# Patient Record
Sex: Male | Born: 1950 | Race: Black or African American | Hispanic: No | Marital: Single | Smoking: Current some day smoker
Health system: Southern US, Community
[De-identification: ages and names within clinical notes are randomized; demographics above are authoritative.]

## PROBLEM LIST (undated history)

## (undated) DIAGNOSIS — E7800 Pure hypercholesterolemia, unspecified: Secondary | ICD-10-CM

## (undated) DIAGNOSIS — B182 Chronic viral hepatitis C: Secondary | ICD-10-CM

## (undated) DIAGNOSIS — K219 Gastro-esophageal reflux disease without esophagitis: Secondary | ICD-10-CM

## (undated) DIAGNOSIS — K625 Hemorrhage of anus and rectum: Secondary | ICD-10-CM

## (undated) DIAGNOSIS — I10 Essential (primary) hypertension: Secondary | ICD-10-CM

## (undated) HISTORY — PX: OTHER SURGICAL HISTORY: SHX169

## (undated) HISTORY — PX: COLONOSCOPY: SHX174

## (undated) HISTORY — PX: HEMORROIDECTOMY: SUR656

## (undated) HISTORY — PX: ABDOMINAL AORTIC ANEURYSM REPAIR: SUR1152

## (undated) HISTORY — PX: CHOLECYSTECTOMY: SHX55

## (undated) HISTORY — PX: HEMORRHOIDECTOMY WITH HEMORRHOID BANDING: SHX5633

---

## 2014-10-18 ENCOUNTER — Emergency Department (HOSPITAL_COMMUNITY)
Admission: EM | Admit: 2014-10-18 | Discharge: 2014-10-18 | Disposition: A | Discharge status: Home / Self Care | Attending: Emergency Medicine | Admitting: Emergency Medicine

## 2014-10-18 ENCOUNTER — Encounter (HOSPITAL_COMMUNITY): Payer: Self-pay | Admitting: *Deleted

## 2014-10-18 DIAGNOSIS — I739 Peripheral vascular disease, unspecified: Secondary | ICD-10-CM | POA: Insufficient documentation

## 2014-10-18 DIAGNOSIS — Z79899 Other long term (current) drug therapy: Secondary | ICD-10-CM | POA: Diagnosis not present

## 2014-10-18 DIAGNOSIS — R200 Anesthesia of skin: Secondary | ICD-10-CM | POA: Diagnosis present

## 2014-10-18 DIAGNOSIS — I10 Essential (primary) hypertension: Secondary | ICD-10-CM | POA: Insufficient documentation

## 2014-10-18 HISTORY — DX: Essential (primary) hypertension: I10

## 2014-10-18 NOTE — ED Notes (Signed)
Discharge instructions explained. Patient and CO both aware that the vasculare surgeon needs to be contact first this in the morning for an appointment. Contact information given.

## 2014-10-18 NOTE — ED Notes (Addendum)
Cramping in legs, numb sensation to legs. Most discomfort in lt leg, "I lose feeling in my feet"  Pt is a prisoner from [facility] with guard

## 2014-10-18 NOTE — ED Provider Notes (Signed)
CSN: [id number]     Arrival date & time 10/18/14  1737 History   First MD Initiated Contact with Patient 10/18/14 1843     No chief complaint on file.    (Consider location/radiation/quality/duration/timing/severity/associated sxs/prior Treatment) HPI Comments: Patient is a 63 year old male whois in the custody of the [facility]. The patient presents to the emergency department because of" cramping and numbness in bilateral legs". The patient states he has had some cramping sensation and some pins and needles sensation for approximately 3 or 4 months. In the last 3 days he states this pain is getting progressively worse. He has function of his foot, but states that it seems to be more intense. The patient states that he has more intense painlower leg and ankle  when he walks a certain distance, the pain gets better with rest. The patient denies any operations or procedures involving his lower extremities. He denies diabetes. He states he has had problems with alcohol in the past, but he has never been diagnosed with any neuropathies. He presents now for evaluation.  The history is provided by the patient.    Past Medical History  Diagnosis Date  . Hypertension    Past Surgical History  Procedure Laterality Date  . Hemorrhoidectomy with hemorrhoid banding    . Cholecystectomy     History reviewed. No pertinent family history. History  Substance Use Topics  . Smoking status: Never Smoker   . Smokeless tobacco: Not on file  . Alcohol Use: No    Review of Systems  Constitutional: Negative for activity change.       All ROS Neg except as noted in HPI  Eyes: Negative for photophobia and discharge.  Respiratory: Negative for cough, shortness of breath and wheezing.   Cardiovascular: Negative for chest pain, palpitations and leg swelling.  Gastrointestinal: Negative for abdominal pain and blood in stool.  Genitourinary: Negative for dysuria, frequency and hematuria.   Musculoskeletal: Positive for arthralgias. Negative for back pain and neck pain.  Skin: Negative.   Neurological: Negative for dizziness, seizures and speech difficulty.  Psychiatric/Behavioral: Negative for hallucinations and confusion.      Allergies  Review of patient's allergies indicates no known allergies.  Home Medications   Prior to Admission medications   Medication Sig Start Date End Date Taking? Authorizing Provider  hydrochlorothiazide (HYDRODIURIL) 25 MG tablet Take 25 mg by mouth daily.   Yes Historical Provider, MD  lisinopril (PRINIVIL,ZESTRIL) 20 MG tablet Take 20 mg by mouth daily.   Yes Historical Provider, MD  simvastatin (ZOCOR) 20 MG tablet Take 20 mg by mouth daily.   Yes Historical Provider, MD   BP 140/101 mmHg  Pulse 65  Temp(Src) 97.8 F (36.6 C) (Oral)  Resp 18  Ht 6\' 1"  (1.854 m)  Wt 180 lb (81.647 kg)  BMI 23.75 kg/m2  SpO2 100% Physical Exam  Constitutional: He is oriented to person, place, and time. He appears well-developed and well-nourished.  Non-toxic appearance.  HENT:  Head: Normocephalic.  Right Ear: Tympanic membrane and external ear normal.  Left Ear: Tympanic membrane and external ear normal.  Eyes: EOM and lids are normal. Pupils are equal, round, and reactive to light.  Neck: Normal range of motion. Neck supple. Carotid bruit is not present.  Cardiovascular: Normal rate, regular rhythm, normal heart sounds, intact distal pulses and normal pulses.   Pulmonary/Chest: Breath sounds normal. No respiratory distress.  Abdominal: Soft. Bowel sounds are normal. There is no tenderness. There is no guarding.  Musculoskeletal: Normal range of motion.  There is full range of motion of the right and left hip, right and left knee. There is decrease hair growth of the right and left lower extremity, left more than right. The left lower extremity is warm from the knee to approximately 3 inches below the anterior tibial tuberosity. There is  coolness but not cold from this point to the foot. The capillary refill is about 3 seconds. There is full range of motion of the toes and the ankle. Patient is ambulatory without problem.  I could not palpate a dorsalis pedis or posterior tibial on pulse on the left.  There is warmth from the anterior tibial tuberosity to the toes on the right, the dorsalis pedis pulse is 1+ and thready.  Lymphadenopathy:       Head (right side): No submandibular adenopathy present.       Head (left side): No submandibular adenopathy present.    He has no cervical adenopathy.  Neurological: He is alert and oriented to person, place, and time. He has normal strength. No cranial nerve deficit or sensory deficit.  Skin: Skin is warm and dry.  Psychiatric: He has a normal mood and affect. His speech is normal.  Nursing note and vitals reviewed.   ED Course Case discussed with Dr. Roderic Palau. Case discussed with Dr Bridgett Larsson (Vascular).  Procedures (including critical care time) Labs Review Labs Reviewed - No data to display  Imaging Review No results found.   EKG Interpretation None      MDM  Patient is a 63 year old male who presents to the emergency department with 3-4 months of tingling sensation in the left lower extremity. He describes symptoms consistent with intermittent claudication. He has good flexion and extension of the extremity. He has some mild temperature change involving the lower extremity, but the lower limb is not cold. The patient is amateur without problem.  Case discussed with Dr. Bridgett Larsson, vascular surgeon. They will arrange to see the patient on tomorrow in the office. Suspect the patient has acute on chronic peripheral vascular disease.   Final diagnoses:  None    *I have reviewed nursing notes, vital signs, and all appropriate lab and imaging results for this patient.**    Lenox Ahr, PA-C 10/18/14 [place], PA-C 10/18/14 [place],  MD 10/18/14 1950

## 2014-10-18 NOTE — ED Notes (Signed)
Only able to locate Posterior Tibia pulse in left foot, unable to doppler any pulses in right foot. Foot is warm. Patient states sensory normal in right foot, sensory is dull and feels like "pins and needles" in his left foot. [place] aware.

## 2014-10-18 NOTE — Discharge Instructions (Signed)
Your examination is consistent with acute on chronic peripheral arterial disease. Please call Dr. Donney Dice Peripheral Vascular Disease Peripheral Vascular Disease (PVD), also called Peripheral Arterial Disease (PAD), is a circulation problem caused by cholesterol (atherosclerotic plaque) deposits in the arteries. PVD commonly occurs in the lower extremities (legs) but it can occur in other areas of the body, such as your arms. The cholesterol buildup in the arteries reduces blood flow which can cause pain and other serious problems. The presence of PVD can place a person at risk for Coronary Artery Disease (CAD).  CAUSES  Causes of PVD can be many. It is usually associated with more than one risk factor such as:   High Cholesterol.  Smoking.  Diabetes.  Lack of exercise or inactivity.  High blood pressure (hypertension).  Obesity.  Family history. SYMPTOMS   When the lower extremities are affected, patients with PVD may experience:  Leg pain with exertion or physical activity. This is called INTERMITTENT CLAUDICATION. This may present as cramping or numbness with physical activity. The location of the pain is associated with the level of blockage. For example, blockage at the abdominal level (distal abdominal aorta) may result in buttock or hip pain. Lower leg arterial blockage may result in calf pain.  As PVD becomes more severe, pain can develop with less physical activity.  In people with severe PVD, leg pain may occur at rest.  Other PVD signs and symptoms:  Leg numbness or weakness.  Coldness in the affected leg or foot, especially when compared to the other leg.  A change in leg color.  Patients with significant PVD are more prone to ulcers or sores on toes, feet or legs. These may take longer to heal or may reoccur. The ulcers or sores can become infected.  If signs and symptoms of PVD are ignored, gangrene may occur. This can result in the loss of toes or loss of an entire  limb.  Not all leg pain is related to PVD. Other medical conditions can cause leg pain such as:  Blood clots (embolism) or Deep Vein Thrombosis.  Inflammation of the blood vessels (vasculitis).  Spinal stenosis. DIAGNOSIS  Diagnosis of PVD can involve several different types of tests. These can include:  Pulse Volume Recording Method (PVR). This test is simple, painless and does not involve the use of X-rays. PVR involves measuring and comparing the blood pressure in the arms and legs. An ABI (Ankle-Brachial Index) is calculated. The normal ratio of blood pressures is 1. As this number becomes smaller, it indicates more severe disease.  < 0.95 - indicates significant narrowing in one or more leg vessels.  <0.8 - there will usually be pain in the foot, leg or buttock with exercise.  <0.4 - will usually have pain in the legs at rest.  <0.25 - usually indicates limb threatening PVD.  Doppler detection of pulses in the legs. This test is painless and checks to see if you have a pulses in your legs/feet.  A dye or contrast material (a substance that highlights the blood vessels so they show up on x-ray) may be given to help your caregiver better see the arteries for the following tests. The dye is eliminated from your body by the kidney's. Your caregiver may order blood work to check your kidney function and other laboratory values before the following tests are performed:  Magnetic Resonance Angiography (MRA). An MRA is a picture study of the blood vessels and arteries. The MRA machine uses a large  magnet to produce images of the blood vessels.  Computed Tomography Angiography (CTA). A CTA is a specialized x-ray that looks at how the blood flows in your blood vessels. An IV may be inserted into your arm so contrast dye can be injected.  Angiogram. Is a procedure that uses x-rays to look at your blood vessels. This procedure is minimally invasive, meaning a small incision (cut) is made in  your groin. A small tube (catheter) is then inserted into the artery of your groin. The catheter is guided to the blood vessel or artery your caregiver wants to examine. Contrast dye is injected into the catheter. X-rays are then taken of the blood vessel or artery. After the images are obtained, the catheter is taken out. TREATMENT  Treatment of PVD involves many interventions which may include:  Lifestyle changes:  Quitting smoking.  Exercise.  Following a low fat, low cholesterol diet.  Control of diabetes.  Foot care is very important to the PVD patient. Good foot care can help prevent infection.  Medication:  Cholesterol-lowering medicine.  Blood pressure medicine.  Anti-platelet drugs.  Certain medicines may reduce symptoms of Intermittent Claudication.  Interventional/Surgical options:  Angioplasty. An Angioplasty is a procedure that inflates a balloon in the blocked artery. This opens the blocked artery to improve blood flow.  Stent Implant. A wire mesh tube (stent) is placed in the artery. The stent expands and stays in place, allowing the artery to remain open.  Peripheral Bypass Surgery. This is a surgical procedure that reroutes the blood around a blocked artery to help improve blood flow. This type of procedure may be performed if Angioplasty or stent implants are not an option. SEEK IMMEDIATE MEDICAL CARE IF:   You develop pain or numbness in your arms or legs.  Your arm or leg turns cold, becomes blue in color.  You develop redness, warmth, swelling and pain in your arms or legs. MAKE SURE YOU:   Understand these instructions.  Will watch your condition.  Will get help right away if you are not doing well or get worse. Document Released: 12/31/2004 Document Revised: 02/15/2012 Document Reviewed: 11/27/2008 [facility] Patient Information 2015 [place], [place]. This information is not intended to replace advice given to you by your health care provider. Make  sure you discuss any questions you have with your health care provider. n's office tomorrow morning for an appointment time. He is going to attempt to work you in for evaluation.

## 2015-01-22 DIAGNOSIS — I10 Essential (primary) hypertension: Secondary | ICD-10-CM | POA: Insufficient documentation

## 2015-01-24 DIAGNOSIS — R945 Abnormal results of liver function studies: Secondary | ICD-10-CM | POA: Insufficient documentation

## 2015-05-14 DIAGNOSIS — I714 Abdominal aortic aneurysm, without rupture, unspecified: Secondary | ICD-10-CM | POA: Insufficient documentation

## 2015-05-14 DIAGNOSIS — I70229 Atherosclerosis of native arteries of extremities with rest pain, unspecified extremity: Secondary | ICD-10-CM | POA: Insufficient documentation

## 2015-06-03 DIAGNOSIS — T148XXA Other injury of unspecified body region, initial encounter: Secondary | ICD-10-CM | POA: Insufficient documentation

## 2016-01-27 DIAGNOSIS — Z1211 Encounter for screening for malignant neoplasm of colon: Secondary | ICD-10-CM | POA: Insufficient documentation

## 2016-03-16 DIAGNOSIS — G5792 Unspecified mononeuropathy of left lower limb: Secondary | ICD-10-CM | POA: Insufficient documentation

## 2016-03-16 DIAGNOSIS — B182 Chronic viral hepatitis C: Secondary | ICD-10-CM | POA: Insufficient documentation

## 2016-03-16 DIAGNOSIS — E782 Mixed hyperlipidemia: Secondary | ICD-10-CM | POA: Insufficient documentation

## 2016-04-30 ENCOUNTER — Ambulatory Visit: Admission: RE | Admit: 2016-04-30 | Payer: Medicaid Other | Source: Ambulatory Visit | Admitting: Gastroenterology

## 2016-04-30 ENCOUNTER — Encounter: Admission: RE | Payer: Self-pay | Source: Ambulatory Visit

## 2016-04-30 SURGERY — COLONOSCOPY WITH PROPOFOL
Anesthesia: General

## 2016-08-20 ENCOUNTER — Ambulatory Visit: Payer: Medicaid Other | Admitting: Anesthesiology

## 2016-08-20 ENCOUNTER — Ambulatory Visit
Admission: RE | Admit: 2016-08-20 | Discharge: 2016-08-20 | Disposition: A | Discharge status: Home / Self Care | Payer: Medicaid Other | Source: Ambulatory Visit | Attending: Gastroenterology | Admitting: Gastroenterology

## 2016-08-20 ENCOUNTER — Encounter
Admission: RE | Disposition: A | Discharge status: Home / Self Care | Payer: Self-pay | Source: Ambulatory Visit | Attending: Gastroenterology

## 2016-08-20 ENCOUNTER — Encounter: Payer: Self-pay | Admitting: Gastroenterology

## 2016-08-20 DIAGNOSIS — D122 Benign neoplasm of ascending colon: Secondary | ICD-10-CM | POA: Insufficient documentation

## 2016-08-20 DIAGNOSIS — K640 First degree hemorrhoids: Secondary | ICD-10-CM | POA: Insufficient documentation

## 2016-08-20 DIAGNOSIS — K625 Hemorrhage of anus and rectum: Secondary | ICD-10-CM | POA: Insufficient documentation

## 2016-08-20 DIAGNOSIS — K5900 Constipation, unspecified: Secondary | ICD-10-CM | POA: Insufficient documentation

## 2016-08-20 DIAGNOSIS — E7800 Pure hypercholesterolemia, unspecified: Secondary | ICD-10-CM | POA: Diagnosis not present

## 2016-08-20 DIAGNOSIS — Z7902 Long term (current) use of antithrombotics/antiplatelets: Secondary | ICD-10-CM | POA: Insufficient documentation

## 2016-08-20 DIAGNOSIS — K219 Gastro-esophageal reflux disease without esophagitis: Secondary | ICD-10-CM | POA: Diagnosis not present

## 2016-08-20 DIAGNOSIS — I10 Essential (primary) hypertension: Secondary | ICD-10-CM | POA: Insufficient documentation

## 2016-08-20 DIAGNOSIS — B182 Chronic viral hepatitis C: Secondary | ICD-10-CM | POA: Insufficient documentation

## 2016-08-20 DIAGNOSIS — Z79899 Other long term (current) drug therapy: Secondary | ICD-10-CM | POA: Insufficient documentation

## 2016-08-20 DIAGNOSIS — Z8719 Personal history of other diseases of the digestive system: Secondary | ICD-10-CM | POA: Diagnosis present

## 2016-08-20 HISTORY — DX: Gastro-esophageal reflux disease without esophagitis: K21.9

## 2016-08-20 HISTORY — DX: Hemorrhage of anus and rectum: K62.5

## 2016-08-20 HISTORY — DX: Chronic viral hepatitis C: B18.2

## 2016-08-20 HISTORY — DX: Pure hypercholesterolemia, unspecified: E78.00

## 2016-08-20 HISTORY — PX: COLONOSCOPY WITH PROPOFOL: SHX5780

## 2016-08-20 LAB — CBC
HCT: 38.4 % — ABNORMAL LOW (ref 40.0–52.0)
Hemoglobin: 13.2 g/dL (ref 13.0–18.0)
MCH: 30.5 pg (ref 26.0–34.0)
MCHC: 34.3 g/dL (ref 32.0–36.0)
MCV: 89.1 fL (ref 80.0–100.0)
Platelets: 105 10*3/uL — ABNORMAL LOW (ref 150–440)
RBC: 4.31 MIL/uL — ABNORMAL LOW (ref 4.40–5.90)
RDW: 13 % (ref 11.5–14.5)
WBC: 3.6 10*3/uL — ABNORMAL LOW (ref 3.8–10.6)

## 2016-08-20 LAB — PROTIME-INR
INR: 1.13
Prothrombin Time: 14.6 seconds (ref 11.4–15.2)

## 2016-08-20 SURGERY — COLONOSCOPY WITH PROPOFOL
Anesthesia: General

## 2016-08-20 MED ORDER — SODIUM CHLORIDE 0.9 % IV SOLN: INTRAVENOUS | Status: DC

## 2016-08-20 MED ORDER — PROPOFOL 500 MG/50ML IV EMUL
INTRAVENOUS | Status: DC | PRN
  Administered 2016-08-20: 120 ug/kg/min via INTRAVENOUS

## 2016-08-20 MED ORDER — MIDAZOLAM HCL 2 MG/2ML IJ SOLN
INTRAMUSCULAR | Status: DC | PRN
  Administered 2016-08-20: 1 mg via INTRAVENOUS

## 2016-08-20 MED ORDER — PROPOFOL 10 MG/ML IV BOLUS
INTRAVENOUS | Status: DC | PRN
  Administered 2016-08-20: 120 mg via INTRAVENOUS

## 2016-08-20 MED ORDER — FENTANYL CITRATE (PF) 100 MCG/2ML IJ SOLN
INTRAMUSCULAR | Status: DC | PRN
  Administered 2016-08-20: 50 ug via INTRAVENOUS

## 2016-08-20 MED ORDER — SODIUM CHLORIDE 0.9 % IV SOLN
INTRAVENOUS | Status: DC
  Administered 2016-08-20: 1000 mL via INTRAVENOUS

## 2016-08-20 MED ORDER — EPHEDRINE SULFATE 50 MG/ML IJ SOLN
INTRAMUSCULAR | Status: DC | PRN
  Administered 2016-08-20 (×2): 10 mg via INTRAVENOUS

## 2016-08-20 NOTE — Op Note (Signed)
[facility] Gastroenterology Patient Name: Clayton Gill Procedure Date: 08/20/2016 8:41 AM MRN: [id number] Account #: [id number] Date of Birth: Oct 07, 1951 Admit Type: Outpatient Age: 65 Room: [facility] ENDO ROOM 4 Gender: Male Note Status: Finalized Procedure:            Colonoscopy Indications:          Rectal bleeding, Change in bowel habits, Constipation Providers:            Lollie Sails, MD Referring MD:         No Local Md, MD (Referring MD) Medicines:            Monitored Anesthesia Care Complications:        No immediate complications. Procedure:            Pre-Anesthesia Assessment:                       - ASA Grade Assessment: III - A patient with severe                        systemic disease.                       After obtaining informed consent, the colonoscope was                        passed under direct vision. Throughout the procedure,                        the patient's blood pressure, pulse, and oxygen                        saturations were monitored continuously. The                        Colonoscope was introduced through the anus and                        advanced to the the cecum, identified by appendiceal                        orifice and ileocecal valve. The colonoscopy was                        performed with moderate difficulty. Successful                        completion of the procedure was aided by changing the                        patient to a prone position. The quality of the bowel                        preparation was good. Findings:      A 12 mm polyp was found in the proximal ascending colon. The polyp was       sessile. The polyp was removed with a hot snare. Resection and retrieval       were complete. To prevent bleeding after the polypectomy, two hemostatic       clips were successfully placed. There was no bleeding at the end of the  maneuver.      Non-bleeding internal hemorrhoids were found during  anoscopy. The       hemorrhoids were small and Grade I (internal hemorrhoids that do not       prolapse).      atypical scarring at the anal orifice, no evidence of fistula.      The exam was otherwise without abnormality. Impression:           - One 12 mm polyp in the proximal ascending colon,                        removed with a hot snare. Resected and retrieved. Clips                        were placed.                       - Non-bleeding internal hemorrhoids.                       - The examination was otherwise normal. Recommendation:       - Await pathology results.                       - Use Citrucel one tablespoon PO daily daily.                       - Discharge patient to home.                       - Return to GI clinic in 1 month. Procedure Code(s):    --- Professional ---                       [id number], Colonoscopy, flexible; with removal of tumor(s),                        polyp(s), or other lesion(s) by snare technique Diagnosis Code(s):    --- Professional ---                       D12.2, Benign neoplasm of ascending colon                       K64.0, First degree hemorrhoids                       K62.5, Hemorrhage of anus and rectum                       R19.4, Change in bowel habit                       K59.00, Constipation, unspecified CPT copyright 2016 American Medical Association. All rights reserved. The codes documented in this report are preliminary and upon coder review may  be revised to meet current compliance requirements. Lollie Sails, MD 08/20/2016 9:26:46 AM This report has been signed electronically. Number of Addenda: 0 Note Initiated On: 08/20/2016 8:41 AM Scope Withdrawal Time: 0 hours 20 minutes 13 seconds  Total Procedure Duration: 0 hours 27 minutes 27 seconds       [facility]

## 2016-08-20 NOTE — Anesthesia Postprocedure Evaluation (Signed)
Anesthesia Post Note  Patient: Clayton Gill  Procedure(s) Performed: Procedure(s) (LRB): COLONOSCOPY WITH PROPOFOL (N/A)  Patient location during evaluation: PACU Anesthesia Type: General Level of consciousness: awake Pain management: pain level controlled Vital Signs Assessment: post-procedure vital signs reviewed and stable Respiratory status: spontaneous breathing Cardiovascular status: stable Anesthetic complications: no    Last Vitals:  Vitals:   08/20/16 0945 08/20/16 0955  BP: 127/73 127/77  Pulse: 63 62  Resp: 13 13  Temp:      Last Pain:  Vitals:   08/20/16 0925  TempSrc: Tympanic  PainSc:                  VAN STAVEREN,Clayton Gill

## 2016-08-20 NOTE — Transfer of Care (Signed)
Immediate Anesthesia Transfer of Care Note  Patient: Clayton Gill  Procedure(s) Performed: Procedure(s): COLONOSCOPY WITH PROPOFOL (N/A)  Patient Location: PACU  Anesthesia Type:General  Level of Consciousness: awake and alert   Airway & Oxygen Therapy: Patient Spontanous Breathing and Patient connected to nasal cannula oxygen  Post-op Assessment: Report given to RN and Post -op Vital signs reviewed and stable  Post vital signs: Reviewed  Last Vitals:  Vitals:   08/20/16 0744  BP: 111/78  Pulse: (!) 55  Resp: 16  Temp: (!) 35.6 C    Last Pain:  Vitals:   08/20/16 0744  TempSrc: Tympanic  PainSc: 4          Complications: No apparent anesthesia complications

## 2016-08-20 NOTE — H&P (Signed)
Outpatient short stay form Pre-procedure 08/20/2016 8:46 AM Clayton Sails MD  Primary Physician: Dr. Ward Givens  Reason for visit:  Colonoscopy  History of present illness:  Patient is a 65 year old male presenting today as above. He is been having increasing episodes of rectal bleeding. He does have a history of hemorrhoids however there is also been a change of bowel habits with more frequent constipation. He does have a history of chronic hepatitis C, his INR today was 1.13 with platelet count platelet count of 105.    Current Facility-Administered Medications:  .  0.9 %  sodium chloride infusion, , Intravenous, Continuous, Clayton Sails, MD, Last Rate: 20 mL/hr at 08/20/16 0806, 1,000 mL at 08/20/16 0806 .  0.9 %  sodium chloride infusion, , Intravenous, Continuous, Clayton Sails, MD  Facility-Administered Medications Ordered in Other Encounters:  .  fentaNYL (SUBLIMAZE) injection, , , Anesthesia Intra-op, Gijsbertus Lonia Mad, MD, 50 mcg at 08/20/16 0842 .  midazolam (VERSED) injection, , , Anesthesia Intra-op, Gijsbertus F Boston Service, MD, 1 mg at 08/20/16 0842 .  propofol (DIPRIVAN) 10 mg/mL bolus/IV push, , , Anesthesia Intra-op, Gijsbertus F Boston Service, MD, 120 mg at 08/20/16 0844  Prescriptions Prior to Admission  Medication Sig Dispense Refill Last Dose  . atorvastatin (LIPITOR) 40 MG tablet Take 40 mg by mouth daily.   Past Week at Unknown time  . clopidogrel (PLAVIX) 75 MG tablet Take 75 mg by mouth daily.   Past Week at Unknown time  . hydrochlorothiazide (HYDRODIURIL) 25 MG tablet Take 25 mg by mouth daily.   Past Week at Unknown time  . lisinopril (PRINIVIL,ZESTRIL) 40 MG tablet Take 40 mg by mouth daily.   Past Week at Unknown time  . pantoprazole (PROTONIX) 40 MG tablet Take 40 mg by mouth daily.   Past Week at Unknown time     No Known Allergies   Past Medical History:  Diagnosis Date  . Chronic hepatitis C ([place])   . GERD  (gastroesophageal reflux disease)   . High cholesterol   . Hypertension   . Rectal bleeding     Review of systems:      Physical Exam    Heart and lungs: Regular rate and rhythm without rub or gallop, lungs are bilaterally clear.    HEENT: Septic atraumatic eyes are anicteric    Other:     Pertinant exam for procedure: Soft nontender nondistended bowel sounds positive normoactive.    Planned proceedures: Colonoscopy and indicated procedures. I have discussed the risks benefits and complications of procedures to include not limited to bleeding, infection, perforation and the risk of sedation and the patient wishes to proceed.    Clayton Sails, MD Gastroenterology 08/20/2016  8:46 AM

## 2016-08-20 NOTE — Anesthesia Preprocedure Evaluation (Signed)
Anesthesia Evaluation  Patient identified by MRN, date of birth, ID band Patient awake    Reviewed: Allergy & Precautions, NPO status , Patient's Chart, lab work & pertinent test results  Airway Mallampati: II       Dental  (+) Teeth Intact   Pulmonary COPD, former smoker,     + decreased breath sounds      Cardiovascular Exercise Tolerance: Good hypertension, Pt. on medications  Rhythm:Regular     Neuro/Psych negative neurological ROS  negative psych ROS   GI/Hepatic GERD  Medicated,(+) Hepatitis -, C  Endo/Other  negative endocrine ROS  Renal/GU      Musculoskeletal   Abdominal Normal abdominal exam  (+)   Peds negative pediatric ROS (+)  Hematology negative hematology ROS (+)   Anesthesia Other Findings   Reproductive/Obstetrics                             Anesthesia Physical Anesthesia Plan  ASA: III  Anesthesia Plan: General   Post-op Pain Management:    Induction: Intravenous  Airway Management Planned: Natural Airway and Nasal Cannula  Additional Equipment:   Intra-op Plan:   Post-operative Plan:   Informed Consent: I have reviewed the patients History and Physical, chart, labs and discussed the procedure including the risks, benefits and alternatives for the proposed anesthesia with the patient or authorized representative who has indicated his/her understanding and acceptance.     Plan Discussed with:   Anesthesia Plan Comments:         Anesthesia Quick Evaluation

## 2016-08-21 LAB — SURGICAL PATHOLOGY

## 2016-08-25 ENCOUNTER — Encounter (HOSPITAL_COMMUNITY): Payer: Self-pay | Admitting: *Deleted

## 2016-09-24 ENCOUNTER — Other Ambulatory Visit: Payer: Self-pay | Admitting: Gastroenterology

## 2016-09-24 DIAGNOSIS — R768 Other specified abnormal immunological findings in serum: Secondary | ICD-10-CM

## 2016-09-29 ENCOUNTER — Ambulatory Visit
Admission: RE | Admit: 2016-09-29 | Discharge: 2016-09-29 | Disposition: A | Discharge status: Home / Self Care | Payer: Medicaid Other | Source: Ambulatory Visit | Attending: Gastroenterology | Admitting: Gastroenterology

## 2016-09-29 DIAGNOSIS — R768 Other specified abnormal immunological findings in serum: Secondary | ICD-10-CM

## 2016-10-06 ENCOUNTER — Ambulatory Visit: Payer: Medicaid Other

## 2016-10-13 ENCOUNTER — Ambulatory Visit
Admission: RE | Admit: 2016-10-13 | Discharge: 2016-10-13 | Disposition: A | Discharge status: Home / Self Care | Payer: Medicaid Other | Source: Ambulatory Visit | Attending: Gastroenterology | Admitting: Gastroenterology

## 2016-10-13 DIAGNOSIS — R160 Hepatomegaly, not elsewhere classified: Secondary | ICD-10-CM | POA: Insufficient documentation

## 2016-10-13 DIAGNOSIS — Z9049 Acquired absence of other specified parts of digestive tract: Secondary | ICD-10-CM | POA: Diagnosis not present

## 2016-10-13 DIAGNOSIS — R768 Other specified abnormal immunological findings in serum: Secondary | ICD-10-CM | POA: Diagnosis present

## 2016-10-20 ENCOUNTER — Other Ambulatory Visit: Payer: Self-pay | Admitting: Gastroenterology

## 2016-10-20 DIAGNOSIS — D1803 Hemangioma of intra-abdominal structures: Secondary | ICD-10-CM

## 2016-10-20 DIAGNOSIS — R9389 Abnormal findings on diagnostic imaging of other specified body structures: Secondary | ICD-10-CM

## 2016-10-27 ENCOUNTER — Ambulatory Visit: Payer: Medicaid Other

## 2016-11-05 ENCOUNTER — Ambulatory Visit
Admission: RE | Admit: 2016-11-05 | Discharge: 2016-11-05 | Disposition: A | Discharge status: Home / Self Care | Payer: Medicaid Other | Source: Ambulatory Visit | Attending: Gastroenterology | Admitting: Gastroenterology

## 2016-11-05 DIAGNOSIS — Z9889 Other specified postprocedural states: Secondary | ICD-10-CM | POA: Insufficient documentation

## 2016-11-05 DIAGNOSIS — D1803 Hemangioma of intra-abdominal structures: Secondary | ICD-10-CM | POA: Diagnosis not present

## 2016-11-05 DIAGNOSIS — R938 Abnormal findings on diagnostic imaging of other specified body structures: Secondary | ICD-10-CM | POA: Insufficient documentation

## 2016-11-05 DIAGNOSIS — Z9049 Acquired absence of other specified parts of digestive tract: Secondary | ICD-10-CM | POA: Diagnosis not present

## 2016-11-05 DIAGNOSIS — R9389 Abnormal findings on diagnostic imaging of other specified body structures: Secondary | ICD-10-CM

## 2016-11-05 LAB — POCT I-STAT CREATININE: CREATININE: 1.3 mg/dL — AB (ref 0.61–1.24)

## 2016-11-05 MED ORDER — GADOBENATE DIMEGLUMINE 529 MG/ML IV SOLN
15.0000 mL | Freq: Once | INTRAVENOUS | Status: AC | PRN
  Administered 2016-11-05: 15 mL via INTRAVENOUS

## 2019-09-15 ENCOUNTER — Other Ambulatory Visit: Payer: Self-pay

## 2019-09-15 DIAGNOSIS — Z20822 Contact with and (suspected) exposure to covid-19: Secondary | ICD-10-CM

## 2019-09-16 LAB — NOVEL CORONAVIRUS, NAA: SARS-CoV-2, NAA: NOT DETECTED

## 2019-10-02 ENCOUNTER — Encounter: Payer: Self-pay | Admitting: Family Medicine

## 2019-10-06 ENCOUNTER — Telehealth: Payer: Self-pay | Admitting: *Deleted

## 2019-10-06 NOTE — Telephone Encounter (Signed)
Received referral for low dose lung cancer screening CT scan. Message left at phone number listed in EMR for patient to call me back to facilitate scheduling scan.  

## 2019-10-09 ENCOUNTER — Telehealth: Payer: Self-pay | Admitting: *Deleted

## 2019-10-09 ENCOUNTER — Encounter: Payer: Self-pay | Admitting: *Deleted

## 2019-10-09 DIAGNOSIS — Z87891 Personal history of nicotine dependence: Secondary | ICD-10-CM

## 2019-10-09 DIAGNOSIS — Z122 Encounter for screening for malignant neoplasm of respiratory organs: Secondary | ICD-10-CM

## 2019-10-09 NOTE — Telephone Encounter (Signed)
Received referral for low dose lung cancer screening CT scan. Message left at phone number listed in EMR for patient to call me back to facilitate scheduling scan.  

## 2019-10-09 NOTE — Telephone Encounter (Signed)
Received referral for initial lung cancer screening scan. Contacted patient and obtained smoking history,(current, 30 pack year) as well as answering questions related to screening process. Patient denies signs of lung cancer such as weight loss or hemoptysis. Patient denies comorbidity that would prevent curative treatment if lung cancer were found. Patient is scheduled for shared decision making visit and CT scan on 10/19/19 at 2pm.

## 2019-10-10 ENCOUNTER — Telehealth: Payer: Self-pay | Admitting: Gastroenterology

## 2019-10-10 ENCOUNTER — Encounter: Payer: Self-pay | Admitting: *Deleted

## 2019-10-10 NOTE — Telephone Encounter (Signed)
Pt left vm regarding his referral

## 2019-10-10 NOTE — Telephone Encounter (Signed)
Attempted to return patients call to schedule his colonoscopy, however his voicemail box is currently full.  Thanks [employer]

## 2019-10-16 ENCOUNTER — Telehealth: Payer: Self-pay

## 2019-10-16 ENCOUNTER — Telehealth: Payer: Self-pay | Admitting: Gastroenterology

## 2019-10-16 NOTE — Telephone Encounter (Signed)
Returned patients call to schedule his colonoscopy, however his voicemail is currently full-unable to LVM.  I sent a mychart message for him to call office back to schedule.  Thanks [employer]

## 2019-10-16 NOTE — Telephone Encounter (Signed)
Pt left vm to schedule a colonoscopy   °

## 2019-10-17 ENCOUNTER — Telehealth: Payer: Self-pay

## 2019-10-17 NOTE — Telephone Encounter (Signed)
Returned patients call to schedule his colonoscopy, unfortunately I was unable to contact him due to his voicemail is currently full.  Thanks [employer]

## 2019-10-18 ENCOUNTER — Encounter: Payer: Self-pay | Admitting: Nurse Practitioner

## 2019-10-19 ENCOUNTER — Other Ambulatory Visit: Payer: Self-pay

## 2019-10-19 ENCOUNTER — Inpatient Hospital Stay: Payer: Medicare HMO | Attending: Nurse Practitioner | Admitting: Nurse Practitioner

## 2019-10-19 ENCOUNTER — Ambulatory Visit
Admission: RE | Admit: 2019-10-19 | Discharge: 2019-10-19 | Disposition: A | Discharge status: Home / Self Care | Payer: Medicare HMO | Source: Ambulatory Visit | Attending: Nurse Practitioner | Admitting: Nurse Practitioner

## 2019-10-19 DIAGNOSIS — Z87891 Personal history of nicotine dependence: Secondary | ICD-10-CM

## 2019-10-19 DIAGNOSIS — Z122 Encounter for screening for malignant neoplasm of respiratory organs: Secondary | ICD-10-CM

## 2019-10-19 NOTE — Progress Notes (Signed)
Virtual Visit via Video Enabled Telemedicine Note   I connected with Clayton Gill on 10/19/19 at 1:30 PM EST by video enabled telemedicine visit and verified that I am speaking with the correct person using two identifiers.   I discussed the limitations, risks, security and privacy concerns of performing an evaluation and management service by telemedicine and the availability of in-person appointments. I also discussed with the patient that there may be a patient responsible charge related to this service. The patient expressed understanding and agreed to proceed.   Other persons participating in the visit and their role in the encounter: Burgess Estelle, RN- checking in patient & navigation  Patient's location: [place]  Provider's location: Clinic  Chief Complaint: Low Dose CT Screening  Patient agreed to evaluation by telemedicine to discuss shared decision making for consideration of low dose CT lung cancer screening.    In accordance with CMS guidelines, patient has met eligibility criteria including age, absence of signs or symptoms of lung cancer.  Social History   Tobacco Use  . Smoking status: Current Every Day Smoker    Packs/day: 0.75    Years: 40.00    Pack years: 30.00    Types: Cigarettes  . Smokeless tobacco: Never Used  Substance Use Topics  . Alcohol use: No     A shared decision-making session was conducted prior to the performance of CT scan. This includes one or more decision aids, includes benefits and harms of screening, follow-up diagnostic testing, over-diagnosis, false positive rate, and total radiation exposure.   Counseling on the importance of adherence to annual lung cancer LDCT screening, impact of co-morbidities, and ability or willingness to undergo diagnosis and treatment is imperative for compliance of the program.   Counseling on the importance of continued smoking cessation for former smokers; the importance of smoking cessation for  current smokers, and information about tobacco cessation interventions have been given to patient including [place] and [address] [employer] [place] programs.   Written order for lung cancer screening with LDCT has been given to the patient and any and all questions have been answered to the best of my abilities.    Yearly follow up will be coordinated by Burgess Estelle, Thoracic Navigator.  I discussed the assessment and treatment plan with the patient. The patient was provided an opportunity to ask questions and all were answered. The patient agreed with the plan and demonstrated an understanding of the instructions.   The patient was advised to call back or seek an in-person evaluation if the symptoms worsen or if the condition fails to improve as anticipated.   I provided 15 minutes of face-to-face video visit time during this encounter, and > 50% was spent counseling as documented under my assessment & plan.   Beckey Rutter, DNP, AGNP-C [facility]

## 2019-10-19 NOTE — Telephone Encounter (Signed)
Pt left vm he state he has left several messages to schedule a colonoscopy and he would like to know what iu going on?

## 2019-10-20 ENCOUNTER — Other Ambulatory Visit: Payer: Self-pay

## 2019-10-20 ENCOUNTER — Telehealth: Payer: Self-pay

## 2019-10-20 DIAGNOSIS — Z860100 Personal history of colon polyps, unspecified: Secondary | ICD-10-CM

## 2019-10-20 DIAGNOSIS — Z1211 Encounter for screening for malignant neoplasm of colon: Secondary | ICD-10-CM

## 2019-10-20 NOTE — Telephone Encounter (Signed)
Gastroenterology Pre-Procedure Review  Request Date: 11/06/19 Requesting Physician: Dr. Vicente Males  PATIENT REVIEW QUESTIONS: The patient responded to the following health history questions as indicated:    1. Are you having any GI issues? yes (stomach discomfort after eating greasy or spicy foods.  declined an appt.) 2. Do you have a personal history of Polyps? yes (2017 with Dr. Gustavo Lah) 3. Do you have a family history of Colon Cancer or Polyps? no 4. Diabetes Mellitus? no 5. Joint replacements in the past 12 months?no 6. Major health problems in the past 3 months?no 7. Any artificial heart valves, MVP, or defibrillator?no    MEDICATIONS & ALLERGIES:    Patient reports the following regarding taking any anticoagulation/antiplatelet therapy:   Plavix, Coumadin, Eliquis, Xarelto, Lovenox, Pradaxa, Brilinta, or Effient? yes (Plavix Prescibed by Dr. Ladoris Gene) Aspirin? no  Patient confirms/reports the following medications:  Current Outpatient Medications  Medication Sig Dispense Refill  . atorvastatin (LIPITOR) 40 MG tablet Take 40 mg by mouth daily.    . clopidogrel (PLAVIX) 75 MG tablet Take 75 mg by mouth daily.    . hydrochlorothiazide (HYDRODIURIL) 25 MG tablet Take 25 mg by mouth daily.    . hydrochlorothiazide (HYDRODIURIL) 25 MG tablet Take 25 mg by mouth daily.    Marland Kitchen lisinopril (PRINIVIL,ZESTRIL) 20 MG tablet Take 20 mg by mouth daily.    Marland Kitchen lisinopril (PRINIVIL,ZESTRIL) 40 MG tablet Take 40 mg by mouth daily.    . pantoprazole (PROTONIX) 40 MG tablet Take 40 mg by mouth daily.    . simvastatin (ZOCOR) 20 MG tablet Take 20 mg by mouth daily.     No current facility-administered medications for this visit.     Patient confirms/reports the following allergies:  No Known Allergies  No orders of the defined types were placed in this encounter.   AUTHORIZATION INFORMATION Primary Insurance: 1D#: Group #:  Secondary Insurance: 1D#: Group #:  SCHEDULE INFORMATION: Date:   Time: Location:

## 2019-10-23 ENCOUNTER — Telehealth: Payer: Self-pay | Admitting: *Deleted

## 2019-10-23 NOTE — Telephone Encounter (Signed)
Notified patient of LDCT lung cancer screening program results with recommendation for 12 month follow up imaging. Also notified of incidental findings noted below and is encouraged to discuss further with PCP who will receive a copy of this note and/or the CT report. Patient verbalizes understanding.   IMPRESSION: 1. Lung-RADS 2S, benign appearance or behavior. Continue annual screening with low-dose chest CT without contrast in 12 months. 2. The "S" modifier above refers to potentially clinically significant non lung cancer related findings. Specifically, there is aortic atherosclerosis, in addition to 2 vessel coronary artery disease. Please note that although the presence of coronary artery calcium documents the presence of coronary artery disease, the severity of this disease and any potential stenosis cannot be assessed on this non-gated CT examination. Assessment for potential risk factor modification, dietary therapy or pharmacologic therapy may be warranted, if clinically indicated. 3. Mild diffuse bronchial wall thickening with mild centrilobular and paraseptal emphysema; imaging findings suggestive of underlying COPD. 4. 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 Attention at time of repeat low-dose lung cancer screening chest CT is recommended to ensure continued stability of this finding. 5. There are calcifications of the aortic valve. Echocardiographic correlation for evaluation of potential valvular dysfunction may be warranted if clinically indicated.  Aortic Atherosclerosis (ICD10-I70.0) and Emphysema (ICD10-J43.9).

## 2019-10-30 ENCOUNTER — Telehealth: Payer: Self-pay

## 2019-10-30 MED ORDER — NA SULFATE-K SULFATE-MG SULF 17.5-3.13-1.6 GM/177ML PO SOLN: 1.0000 | Freq: Once | ORAL | 0 refills | Status: AC

## 2019-10-30 NOTE — Telephone Encounter (Signed)
Patient has been advised to stop Plavix today, and restart 24 hours after his colonoscopy-per [facility] Request received from Dr. Ladoris Gene today.  Thanks [employer]

## 2019-11-01 ENCOUNTER — Other Ambulatory Visit
Admission: RE | Admit: 2019-11-01 | Discharge: 2019-11-01 | Disposition: A | Discharge status: Home / Self Care | Payer: Medicare HMO | Source: Ambulatory Visit | Attending: Gastroenterology | Admitting: Gastroenterology

## 2019-11-01 ENCOUNTER — Other Ambulatory Visit: Payer: Self-pay

## 2019-11-01 DIAGNOSIS — Z20828 Contact with and (suspected) exposure to other viral communicable diseases: Secondary | ICD-10-CM | POA: Insufficient documentation

## 2019-11-01 DIAGNOSIS — Z01812 Encounter for preprocedural laboratory examination: Secondary | ICD-10-CM | POA: Diagnosis present

## 2019-11-01 LAB — SARS CORONAVIRUS 2 (TAT 6-24 HRS): SARS Coronavirus 2: NEGATIVE

## 2019-11-03 ENCOUNTER — Other Ambulatory Visit: Payer: Medicare HMO

## 2019-11-06 ENCOUNTER — Ambulatory Visit
Admission: RE | Admit: 2019-11-06 | Discharge: 2019-11-06 | Disposition: A | Discharge status: Home / Self Care | Payer: Medicare HMO | Attending: Gastroenterology | Admitting: Gastroenterology

## 2019-11-06 ENCOUNTER — Ambulatory Visit: Payer: Medicare HMO | Admitting: Anesthesiology

## 2019-11-06 ENCOUNTER — Encounter
Admission: RE | Disposition: A | Discharge status: Home / Self Care | Payer: Self-pay | Source: Home / Self Care | Attending: Gastroenterology

## 2019-11-06 DIAGNOSIS — Z86010 Personal history of colonic polyps: Secondary | ICD-10-CM | POA: Diagnosis present

## 2019-11-06 DIAGNOSIS — K219 Gastro-esophageal reflux disease without esophagitis: Secondary | ICD-10-CM | POA: Diagnosis not present

## 2019-11-06 DIAGNOSIS — E7800 Pure hypercholesterolemia, unspecified: Secondary | ICD-10-CM | POA: Insufficient documentation

## 2019-11-06 DIAGNOSIS — F17210 Nicotine dependence, cigarettes, uncomplicated: Secondary | ICD-10-CM | POA: Diagnosis not present

## 2019-11-06 DIAGNOSIS — I10 Essential (primary) hypertension: Secondary | ICD-10-CM | POA: Diagnosis not present

## 2019-11-06 DIAGNOSIS — Z79899 Other long term (current) drug therapy: Secondary | ICD-10-CM | POA: Insufficient documentation

## 2019-11-06 DIAGNOSIS — B182 Chronic viral hepatitis C: Secondary | ICD-10-CM | POA: Insufficient documentation

## 2019-11-06 DIAGNOSIS — Z1211 Encounter for screening for malignant neoplasm of colon: Secondary | ICD-10-CM

## 2019-11-06 DIAGNOSIS — K635 Polyp of colon: Secondary | ICD-10-CM | POA: Diagnosis not present

## 2019-11-06 HISTORY — PX: COLONOSCOPY WITH PROPOFOL: SHX5780

## 2019-11-06 SURGERY — COLONOSCOPY WITH PROPOFOL
Anesthesia: General

## 2019-11-06 MED ORDER — PROPOFOL 10 MG/ML IV BOLUS
INTRAVENOUS | Status: DC | PRN
  Administered 2019-11-06: 40 mg via INTRAVENOUS
  Administered 2019-11-06: 50 mg via INTRAVENOUS

## 2019-11-06 MED ORDER — PROPOFOL 500 MG/50ML IV EMUL
INTRAVENOUS | Status: DC | PRN
  Administered 2019-11-06: 150 ug/kg/min via INTRAVENOUS

## 2019-11-06 MED ORDER — SODIUM CHLORIDE 0.9 % IV SOLN
INTRAVENOUS | Status: DC
  Administered 2019-11-06: 1000 mL via INTRAVENOUS
  Administered 2019-11-06: 11:00:00 via INTRAVENOUS

## 2019-11-06 NOTE — Op Note (Signed)
[facility] Gastroenterology Patient Name: Clayton Gill Procedure Date: 11/06/2019 10:18 AM MRN: [id number] Account #: [id number] Date of Birth: 04-21-1951 Admit Type: Outpatient Age: 68 Room: [facility] ENDO ROOM 4 Gender: Male Note Status: Finalized Procedure:             Colonoscopy Indications:           High risk colon cancer surveillance: Personal history                         of colonic polyps Providers:             Jonathon Bellows MD, MD Medicines:             Monitored Anesthesia Care Complications:         No immediate complications. Procedure:             Pre-Anesthesia Assessment:                        - Prior to the procedure, a History and Physical was                         performed, and patient medications, allergies and                         sensitivities were reviewed. The patient's tolerance                         of previous anesthesia was reviewed.                        - The risks and benefits of the procedure and the                         sedation options and risks were discussed with the                         patient. All questions were answered and informed                         consent was obtained.                        - ASA Grade Assessment: II - A patient with mild                         systemic disease.                        After obtaining informed consent, the colonoscope was                         passed under direct vision. Throughout the procedure,                         the patient's blood pressure, pulse, and oxygen                         saturations were monitored continuously. The  Colonoscope was introduced through the anus and                         advanced to the the cecum, identified by the                         appendiceal orifice. The colonoscopy was performed                         with ease. The patient tolerated the procedure well.                         The quality of  the bowel preparation was good. Findings:      The perianal and digital rectal examinations were normal.      A 7 mm polyp was found in the sigmoid colon. The polyp was sessile. The       polyp was removed with a cold snare. Resection and retrieval were       complete.      The exam was otherwise without abnormality on direct and retroflexion       views. Impression:            - One 7 mm polyp in the sigmoid colon, removed with a                         cold snare. Resected and retrieved.                        - The examination was otherwise normal on direct and                         retroflexion views. Recommendation:        - Discharge patient to home (with escort).                        - Resume previous diet.                        - Continue present medications.                        - Await pathology results.                        - Repeat colonoscopy in 5 years for surveillance. Procedure Code(s):     --- Professional ---                        [phone], Colonoscopy, flexible; with removal of                         tumor(s), polyp(s), or other lesion(s) by snare                         technique Diagnosis Code(s):     --- Professional ---                        K63.5, Polyp of colon  Z86.010, Personal history of colonic polyps CPT copyright 2019 American Medical Association. All rights reserved. The codes documented in this report are preliminary and upon coder review may  be revised to meet current compliance requirements. Jonathon Bellows, MD Jonathon Bellows MD, MD 11/06/2019 10:47:38 AM This report has been signed electronically. Number of Addenda: 0 Note Initiated On: 11/06/2019 10:18 AM Scope Withdrawal Time: 0 hours 10 minutes 17 seconds  Total Procedure Duration: 0 hours 12 minutes 56 seconds  Estimated Blood Loss:  Estimated blood loss: none.      [facility]

## 2019-11-06 NOTE — Transfer of Care (Signed)
Immediate Anesthesia Transfer of Care Note  Patient: Clayton Gill  Procedure(s) Performed: COLONOSCOPY WITH PROPOFOL (N/A )  Patient Location: PACU  Anesthesia Type:General  Level of Consciousness: awake, alert  and oriented  Airway & Oxygen Therapy: Patient Spontanous Breathing  Post-op Assessment: Report given to RN and Post -op Vital signs reviewed and stable  Post vital signs: Reviewed and stable  Last Vitals:  Vitals Value Taken Time  BP 98/73 11/06/19 1051  Temp 35.8 C 11/06/19 1051  Pulse 73 11/06/19 1056  Resp 21 11/06/19 1056  SpO2 100 % 11/06/19 1056  Vitals shown include unvalidated device data.  Last Pain:  Vitals:   11/06/19 0912  TempSrc: Tympanic         Complications: No apparent anesthesia complications

## 2019-11-06 NOTE — Anesthesia Post-op Follow-up Note (Signed)
Anesthesia QCDR form completed.        

## 2019-11-06 NOTE — Anesthesia Postprocedure Evaluation (Signed)
Anesthesia Post Note  Patient: Theatre manager  Procedure(s) Performed: COLONOSCOPY WITH PROPOFOL (N/A )  Patient location during evaluation: PACU Anesthesia Type: General Level of consciousness: awake and alert Pain management: pain level controlled Vital Signs Assessment: post-procedure vital signs reviewed and stable Respiratory status: spontaneous breathing, nonlabored ventilation and respiratory function stable Cardiovascular status: blood pressure returned to baseline and stable Postop Assessment: no apparent nausea or vomiting Anesthetic complications: no     Last Vitals:  Vitals:   11/06/19 0912 11/06/19 1051  BP: 117/84 98/73  Pulse: 67   Resp: 16   Temp: (!) 36.1 C (!) 35.8 C  SpO2: 100%     Last Pain:  Vitals:   11/06/19 1121  TempSrc:   PainSc: 0-No pain                 Durenda Hurt

## 2019-11-06 NOTE — Anesthesia Preprocedure Evaluation (Addendum)
Anesthesia Evaluation  Patient identified by MRN, date of birth, ID band Patient awake    Reviewed: Allergy & Precautions, H&P , NPO status , Patient's Chart, lab work & pertinent test results  Airway Mallampati: II  TM Distance: >3 FB     Dental  (+) Upper Dentures, Lower Dentures   Pulmonary Current Smoker,           Cardiovascular hypertension,      Neuro/Psych negative neurological ROS  negative psych ROS   GI/Hepatic GERD  Controlled,(+) Hepatitis -, C  Endo/Other  negative endocrine ROS  Renal/GU negative Renal ROS  negative genitourinary   Musculoskeletal   Abdominal   Peds  Hematology negative hematology ROS (+)   Anesthesia Other Findings Past Medical History: No date: Chronic hepatitis C (HCC) No date: GERD (gastroesophageal reflux disease) No date: High cholesterol No date: Hypertension No date: Rectal bleeding  Past Surgical History: No date: ABDOMINAL AORTIC ANEURYSM REPAIR No date: CHOLECYSTECTOMY No date: COLONOSCOPY 08/20/2016: COLONOSCOPY WITH PROPOFOL; N/A     Comment:  Procedure: COLONOSCOPY WITH PROPOFOL;  Surgeon: Lollie Sails, MD;  Location: [facility] ENDOSCOPY;  Service:               Endoscopy;  Laterality: N/A; No date: HEMORRHOIDECTOMY WITH HEMORRHOID BANDING No date: HEMORROIDECTOMY No date: surgery left leg     Reproductive/Obstetrics negative OB ROS                            Anesthesia Physical Anesthesia Plan  ASA: III  Anesthesia Plan: General   Post-op Pain Management:    Induction:   PONV Risk Score and Plan: Propofol infusion and TIVA  Airway Management Planned: Natural Airway and Nasal Cannula  Additional Equipment:   Intra-op Plan:   Post-operative Plan:   Informed Consent: I have reviewed the patients History and Physical, chart, labs and discussed the procedure including the risks, benefits and alternatives for  the proposed anesthesia with the patient or authorized representative who has indicated his/her understanding and acceptance.     Dental Advisory Given  Plan Discussed with: Anesthesiologist  Anesthesia Plan Comments:         Anesthesia Quick Evaluation

## 2019-11-06 NOTE — Anesthesia Procedure Notes (Signed)
Date/Time: 11/06/2019 10:25 AM Performed by: Allean Found, CRNA Pre-anesthesia Checklist: Patient identified, Emergency Drugs available, Suction available, Patient being monitored and Timeout performed Patient Re-evaluated:Patient Re-evaluated prior to induction Oxygen Delivery Method: Nasal cannula Placement Confirmation: positive ETCO2

## 2019-11-06 NOTE — H&P (Signed)
Jonathon Bellows, MD [address] [place], [place], [place], [address] [address], [place], [place], [place], [address] Phone: [phone]  Fax: [phone]  Primary Care Physician:  Theotis Burrow, MD   Pre-Procedure History & Physical: HPI:  Arhum Cayabyab is a 68 y.o. male is here for an colonoscopy.   Past Medical History:  Diagnosis Date  . Chronic hepatitis C ([place])   . GERD (gastroesophageal reflux disease)   . High cholesterol   . Hypertension   . Rectal bleeding     Past Surgical History:  Procedure Laterality Date  . ABDOMINAL AORTIC ANEURYSM REPAIR    . CHOLECYSTECTOMY    . COLONOSCOPY    . COLONOSCOPY WITH PROPOFOL N/A 08/20/2016   Procedure: COLONOSCOPY WITH PROPOFOL;  Surgeon: Lollie Sails, MD;  Location: [facility] ENDOSCOPY;  Service: Endoscopy;  Laterality: N/A;  . HEMORRHOIDECTOMY WITH HEMORRHOID BANDING    . HEMORROIDECTOMY    . surgery left leg      Prior to Admission medications   Medication Sig Start Date End Date Taking? Authorizing Provider  atorvastatin (LIPITOR) 40 MG tablet Take 40 mg by mouth daily.   Yes [provider]  clopidogrel (PLAVIX) 75 MG tablet Take 75 mg by mouth daily.   Yes [provider]  hydrochlorothiazide (HYDRODIURIL) 25 MG tablet Take 25 mg by mouth daily.   Yes [provider]  hydrochlorothiazide (HYDRODIURIL) 25 MG tablet Take 25 mg by mouth daily.   Yes [provider]  lisinopril (PRINIVIL,ZESTRIL) 20 MG tablet Take 20 mg by mouth daily.   Yes [provider]  lisinopril (PRINIVIL,ZESTRIL) 40 MG tablet Take 40 mg by mouth daily.   Yes [provider]  pantoprazole (PROTONIX) 40 MG tablet Take 40 mg by mouth daily.   Yes [provider]  simvastatin (ZOCOR) 20 MG tablet Take 20 mg by mouth daily.   Yes [provider]    Allergies as of 10/23/2019  . (No Known Allergies)    No family history on file.  Social History    Socioeconomic History  . Marital status: Single    Spouse name: Not on file  . Number of children: Not on file  . Years of education: Not on file  . Highest education level: Not on file  Occupational History  . Not on file  Social Needs  . Financial resource strain: Not on file  . Food insecurity    Worry: Not on file    Inability: Not on file  . Transportation needs    Medical: Not on file    Non-medical: Not on file  Tobacco Use  . Smoking status: Current Every Day Smoker    Packs/day: 0.75    Years: 40.00    Pack years: 30.00    Types: Cigarettes  . Smokeless tobacco: Never Used  Substance and Sexual Activity  . Alcohol use: No  . Drug use: No  . Sexual activity: Not on file  Lifestyle  . Physical activity    Days per week: Not on file    Minutes per session: Not on file  . Stress: Not on file  Relationships  . Social Herbalist on phone: Not on file    Gets together: Not on file    Attends religious service: Not on file    Active member of club or organization: Not on file    Attends meetings of clubs or organizations: Not on file    Relationship status:  Not on file  . Intimate partner violence    Fear of current or ex partner: Not on file    Emotionally abused: Not on file    Physically abused: Not on file    Forced sexual activity: Not on file  Other Topics Concern  . Not on file  Social History Narrative   ** Merged History Encounter **        Review of Systems: See HPI, otherwise negative ROS  Physical Exam: BP 117/84   Pulse 67   Temp (!) 96.9 F (36.1 C) (Tympanic)   Resp 16   SpO2 100%  General:   Alert,  pleasant and cooperative in NAD Head:  Normocephalic and atraumatic. Neck:  Supple; no masses or thyromegaly. Lungs:  Clear throughout to auscultation, normal respiratory effort.    Heart:  +S1, +S2, Regular rate and rhythm, No edema. Abdomen:  Soft, nontender and nondistended. Normal bowel sounds, without guarding, and without  rebound.   Neurologic:  Alert and  oriented x4;  grossly normal neurologically.  Impression/Plan: Romolo Silvan is here for an colonoscopy to be performed for surveillance due to prior history of colon polyps   Risks, benefits, limitations, and alternatives regarding  colonoscopy have been reviewed with the patient.  Questions have been answered.  All parties agreeable.   Jonathon Bellows, MD  11/06/2019, 10:24 AM

## 2019-11-07 ENCOUNTER — Encounter: Payer: Self-pay | Admitting: Gastroenterology

## 2019-11-07 LAB — SURGICAL PATHOLOGY

## 2019-11-10 ENCOUNTER — Other Ambulatory Visit: Payer: Self-pay

## 2019-11-10 ENCOUNTER — Ambulatory Visit (INDEPENDENT_AMBULATORY_CARE_PROVIDER_SITE_OTHER): Payer: Medicare HMO | Admitting: Vascular Surgery

## 2019-11-10 ENCOUNTER — Encounter (INDEPENDENT_AMBULATORY_CARE_PROVIDER_SITE_OTHER): Payer: Self-pay | Admitting: Vascular Surgery

## 2019-11-10 VITALS — BP 107/75 | HR 65 | Resp 16 | Wt 171.2 lb

## 2019-11-10 DIAGNOSIS — I70221 Atherosclerosis of native arteries of extremities with rest pain, right leg: Secondary | ICD-10-CM | POA: Diagnosis not present

## 2019-11-10 DIAGNOSIS — I10 Essential (primary) hypertension: Secondary | ICD-10-CM | POA: Diagnosis not present

## 2019-11-10 DIAGNOSIS — I714 Abdominal aortic aneurysm, without rupture, unspecified: Secondary | ICD-10-CM

## 2019-11-10 DIAGNOSIS — M792 Neuralgia and neuritis, unspecified: Secondary | ICD-10-CM | POA: Insufficient documentation

## 2019-11-10 NOTE — Assessment & Plan Note (Signed)
blood pressure control important in reducing the progression of atherosclerotic disease. On appropriate oral medications.  

## 2019-11-10 NOTE — Progress Notes (Signed)
Patient ID: Clayton Gill, male   DOB: 12-Dec-1950, 68 y.o.   MRN: [id number]  Chief Complaint  Patient presents with   New Patient (Initial Visit)    ref Ladoris Gene for PVD w/Claudication    HPI Clayton Gill is a 68 y.o. male.  I am asked to see the patient by Dr. Ladoris Gene for evaluation of PAD.  The patient has undergone a previous left leg bypass for severe PAD on the left leg.  He has disabling claudication symptoms on the right leg and can only walk about 50 feet before having to stop and rest.  He has never had any work done on the left leg.  His previous interventions and surgery were done at [facility].  He does not have open ulceration or infection.  The pain does sometimes wake him at night.  He still has some discomfort in the left leg with activity although it is fairly mild at this point.  There is no clear cause or inciting event that started the symptoms.  Nothing really made it better.  The pain is located in the muscular part of the thigh and calf on the right leg.     Past Medical History:  Diagnosis Date   Chronic hepatitis C (HCC)    GERD (gastroesophageal reflux disease)    High cholesterol    Hypertension    Rectal bleeding     Past Surgical History:  Procedure Laterality Date   ABDOMINAL AORTIC ANEURYSM REPAIR     CHOLECYSTECTOMY     COLONOSCOPY     COLONOSCOPY WITH PROPOFOL N/A 08/20/2016   Procedure: COLONOSCOPY WITH PROPOFOL;  Surgeon: Lollie Sails, MD;  Location: [facility] ENDOSCOPY;  Service: Endoscopy;  Laterality: N/A;   COLONOSCOPY WITH PROPOFOL N/A 11/06/2019   Procedure: COLONOSCOPY WITH PROPOFOL;  Surgeon: Jonathon Bellows, MD;  Location: [facility] ENDOSCOPY;  Service: Gastroenterology;  Laterality: N/A;   HEMORRHOIDECTOMY WITH HEMORRHOID BANDING     HEMORROIDECTOMY     surgery left leg    Left leg bypass surgery   Family History  Problem Relation Age of Onset   Hypertension Mother    Diabetes Mother   No bleeding or clotting  disorders   Social History   Tobacco Use   Smoking status: Current Every Day Smoker    Packs/day: 0.75    Years: 40.00    Pack years: 30.00    Types: Cigarettes   Smokeless tobacco: Never Used  Substance Use Topics   Alcohol use: No   Drug use: No    No Known Allergies  Current Outpatient Medications  Medication Sig Dispense Refill   amLODipine (NORVASC) 10 MG tablet Take 10 mg by mouth daily.     atorvastatin (LIPITOR) 40 MG tablet Take 40 mg by mouth daily.     clopidogrel (PLAVIX) 75 MG tablet Take 75 mg by mouth daily.     hydrochlorothiazide (HYDRODIURIL) 25 MG tablet Take 25 mg by mouth daily.     hydrochlorothiazide (HYDRODIURIL) 25 MG tablet Take 25 mg by mouth daily.     omeprazole (PRILOSEC) 20 MG capsule Take 20 mg by mouth daily.     pantoprazole (PROTONIX) 40 MG tablet Take 40 mg by mouth daily.     simvastatin (ZOCOR) 20 MG tablet Take 20 mg by mouth daily.     lisinopril (PRINIVIL,ZESTRIL) 20 MG tablet Take 20 mg by mouth daily.     lisinopril (PRINIVIL,ZESTRIL) 40 MG tablet Take 40 mg by mouth daily.  No current facility-administered medications for this visit.       REVIEW OF SYSTEMS (Negative unless checked)  Constitutional: [] Weight loss  [] Fever  [] Chills Cardiac: [] Chest pain   [] Chest pressure   [] Palpitations   [] Shortness of breath when laying flat   [] Shortness of breath at rest   [] Shortness of breath with exertion. Vascular:  [x] Pain in legs with walking   [] Pain in legs at rest   [] Pain in legs when laying flat   [x] Claudication   [x] Pain in feet when walking  [] Pain in feet at rest  [] Pain in feet when laying flat   [] History of DVT   [] Phlebitis   [] Swelling in legs   [] Varicose veins   [] Non-healing ulcers Pulmonary:   [] Uses home oxygen   [] Productive cough   [] Hemoptysis   [] Wheeze  [] COPD   [] Asthma Neurologic:  [] Dizziness  [] Blackouts   [] Seizures   [] History of stroke   [] History of TIA  [] Aphasia   [] Temporary blindness    [] Dysphagia   [] Weakness or numbness in arms   [] Weakness or numbness in legs Musculoskeletal:  [x] Arthritis   [] Joint swelling   [] Joint pain   [] Low back pain Hematologic:  [] Easy bruising  [] Easy bleeding   [] Hypercoagulable state   [] Anemic  [] Hepatitis Gastrointestinal:  [] Blood in stool   [] Vomiting blood  [x] Gastroesophageal reflux/heartburn   [] Abdominal pain Genitourinary:  [] Chronic kidney disease   [] Difficult urination  [] Frequent urination  [] Burning with urination   [] Hematuria Skin:  [] Rashes   [] Ulcers   [] Wounds Psychological:  [] History of anxiety   []  History of major depression.    Physical Exam BP 107/75 (BP Location: Left Arm)    Pulse 65    Resp 16    Wt 171 lb 3.2 oz (77.7 kg)    BMI 22.59 kg/m  Gen:  WD/WN, NAD Head: [place]/AT, No temporalis wasting.  Ear/Nose/Throat: Hearing grossly intact, nares w/o erythema or drainage, oropharynx w/o Erythema/Exudate Eyes: Conjunctiva clear, sclera non-icteric  Neck: trachea midline.  No JVD.  Pulmonary:  Good air movement, respirations not labored, no use of accessory muscles  Cardiac: RRR, no JVD Vascular:  Vessel Right Left  Radial Palpable Palpable                          DP  not palpable  trace  PT  1+  2+   Gastrointestinal:. No masses, surgical incisions, or scars. Musculoskeletal: M/S 5/5 throughout.  Extremities without ischemic changes.  No deformity or atrophy.  Well-healed surgical scar on the left medial leg.  Trace left lower extremity edema. Neurologic: Sensation grossly intact in extremities.  Symmetrical.  Speech is fluent. Motor exam as listed above. Psychiatric: Judgment intact, Mood & affect appropriate for pt's clinical situation. Dermatologic: No rashes or ulcers noted.  No cellulitis or open wounds.    Radiology Ct Chest Lung Cancer Screening Low Dose Wo Contrast  Result Date: 10/19/2019 CLINICAL DATA:  68 year old male current smoker with 30 pack-year history of smoking. Lung cancer  screening examination. EXAM: CT CHEST WITHOUT CONTRAST LOW-DOSE FOR LUNG CANCER SCREENING TECHNIQUE: Multidetector CT imaging of the chest was performed following the standard protocol without IV contrast. COMPARISON:  No priors. FINDINGS: Cardiovascular: Heart size is normal. There is no significant pericardial fluid, thickening or pericardial calcification. There is aortic atherosclerosis, as well as atherosclerosis of the great vessels of the mediastinum and the coronary arteries, including calcified atherosclerotic plaque in the left anterior descending  and right coronary arteries. Calcifications of the aortic valve. 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 Mediastinum/Nodes: No pathologically enlarged mediastinal or hilar lymph nodes. Please note that accurate exclusion of hilar adenopathy is limited on noncontrast CT scans. Esophagus is unremarkable in appearance. No axillary lymphadenopathy. Lungs/Pleura: Small pulmonary nodules are noted in the lungs bilaterally, largest of which is in the posterior aspect of the left upper lobe abutting the major fissure (axial image 109 of series 3), with a volume derived mean diameter of only 4.5 mm. No larger more suspicious appearing pulmonary nodules or masses are noted. No acute consolidative airspace disease. No pleural effusions. Diffuse bronchial wall thickening with mild centrilobular and paraseptal emphysema. Upper Abdomen: Status post cholecystectomy. Aortic atherosclerosis. Incompletely imaged aortic stent graft in the abdominal aorta. Musculoskeletal: There are no aggressive appearing lytic or blastic lesions noted in the visualized portions of the skeleton. IMPRESSION: 1. Lung-RADS 2S, benign appearance or behavior. Continue annual screening with low-dose chest CT without contrast in 12 months. 2. The "S" modifier above refers to potentially clinically significant non lung cancer related findings. Specifically, there is aortic  atherosclerosis, in addition to 2 vessel coronary artery disease. Please note that although the presence of coronary artery calcium documents the presence of coronary artery disease, the severity of this disease and any potential stenosis cannot be assessed on this non-gated CT examination. Assessment for potential risk factor modification, dietary therapy or pharmacologic therapy may be warranted, if clinically indicated. 3. Mild diffuse bronchial wall thickening with mild centrilobular and paraseptal emphysema; imaging findings suggestive of underlying COPD. 4. 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 Attention at time of repeat low-dose lung cancer screening chest CT is recommended to ensure continued stability of this finding. 5. There are calcifications of the aortic valve. Echocardiographic correlation for evaluation of potential valvular dysfunction may be warranted if clinically indicated. Aortic Atherosclerosis (ICD10-I70.0) and Emphysema (ICD10-J43.9). Electronically Signed   By: Vinnie Langton M.D.   On: 10/19/2019 15:27    Labs Recent Results (from the past 2160 hour(s))  Novel Coronavirus, NAA ([facility])     Status: None   Collection Time: 09/15/19 12:00 AM   Specimen: Nasopharyngeal(NP) swabs in vial transport medium   NASOPHARYNGE  TESTING  Result Value Ref Range   SARS-CoV-2, NAA Not Detected Not Detected    Comment: Testing was performed using the cobas(R) SARS-CoV-2 test. This nucleic acid amplification test was developed and its performance characteristics determined by [employer]. Nucleic acid amplification tests include PCR and TMA. This test has not been FDA cleared or approved. This test has been authorized by FDA under an Emergency Use Authorization (EUA). This test is only authorized for the duration of time the declaration that circumstances exist justifying the authorization of the emergency use of in vitro diagnostic tests for detection  of SARS-CoV-2 virus and/or diagnosis of COVID-19 infection under section 564(b)(1) of the Act, 21 U.S.C. [id number]) (1), unless the authorization is terminated or revoked sooner. When diagnostic testing is negative, the possibility of a false negative result should be considered in the context of a patient's recent exposures and the presence of clinical signs and symptoms consistent with COVID-19. An individual without symptoms  of COVID-19 and who is not shedding SARS-CoV-2 virus would expect to have a negative (not detected) result in this assay.   SARS CORONAVIRUS 2 (TAT 6-24 HRS) Nasopharyngeal Nasopharyngeal Swab     Status: None   Collection Time: 11/01/19 11:01 AM  Specimen: Nasopharyngeal Swab  Result Value Ref Range   SARS Coronavirus 2 NEGATIVE NEGATIVE    Comment: (NOTE) SARS-CoV-2 target nucleic acids are NOT DETECTED. The SARS-CoV-2 RNA is generally detectable in upper and lower respiratory specimens during the acute phase of infection. Negative results do not preclude SARS-CoV-2 infection, do not rule out co-infections with other pathogens, and should not be used as the sole basis for treatment or other patient management decisions. Negative results must be combined with clinical observations, patient history, and epidemiological information. The expected result is Negative. Fact Sheet for Patients: SugarRoll.be Fact Sheet for Healthcare Providers: https://www.woods-mathews.com/ This test is not yet approved or cleared by the [place] FDA and  has been authorized for detection and/or diagnosis of SARS-CoV-2 by FDA under an Emergency Use Authorization (EUA). This EUA will remain  in effect (meaning this test can be used) for the duration of the COVID-19 declaration under Section 56 4(b)(1) of the Act, 21 U.S.C. section 360bbb-3(b)(1), unless the authorization is terminated or revoked sooner. Performed at [facility], [place] [address], [place], [place] [address]   Surgical pathology     Status: None   Collection Time: 11/06/19 10:42 AM  Result Value Ref Range   SURGICAL PATHOLOGY      SURGICAL PATHOLOGY CASE: [id number] PATIENT: Verdis Frederickson Surgical Pathology Report     Specimen Submitted: A. Colon polyp, sigmoid; cold snare  Clinical History: Screening colonoscopy, personal history of colon polyps. Colon polyp.      DIAGNOSIS: A. COLON POLYP, SIGMOID; COLD SNARE: - HYPERPLASTIC POLYP. - NEGATIVE FOR DYSPLASIA AND MALIGNANCY.   GROSS DESCRIPTION: A. Labeled: Cold snare polyp sigmoid colon Received: In formalin Tissue fragment(s): 1 Size: 1.1 x 0.2 x 0.2 cm Description: Tan soft tissue fragment Entirely submitted in 1 cassette.    Final Diagnosis performed by Betsy Pries, MD.   Electronically signed 11/07/2019 8:35:40AM The electronic signature indicates that the named Attending Pathologist has evaluated the specimen Technical component performed at [facility], [address], [place], [place] [address] Lab: [phone] Dir: Rush Farmer, MD, MMM  Professional component performed at [facility], [place], [place], [place] [address] Lab: [phone] Dir: Dellia Nims. Reuel Derby, MD     Assessment/Plan:  No problem-specific Assessment & Plan notes found for this encounter.      Leotis Pain 11/10/2019, 10:52 AM   This note was created with Dragon medical transcription system.  Any errors from dictation are unintentional.

## 2019-11-10 NOTE — Assessment & Plan Note (Signed)
Not sure when this was last checked.  May complicate his angiograms and make it difficult to go up and over for treatment.  We will also need to be checked at some point going forward with noninvasive studies.

## 2019-11-10 NOTE — Patient Instructions (Signed)
Peripheral Vascular Disease  Peripheral vascular disease (PVD) is a disease of the blood vessels that are not part of your heart and brain. A simple term for PVD is poor circulation. In most cases, PVD narrows the blood vessels that carry blood from your heart to the rest of your body. This can reduce the supply of blood to your arms, legs, and internal organs, like your stomach or kidneys. However, PVD most often affects a person's lower legs and feet. Without treatment, PVD tends to get worse. PVD can also lead to acute ischemic limb. This is when an arm or leg suddenly cannot get enough blood. This is a medical emergency. Follow these instructions at home: Lifestyle  Do not use any products that contain nicotine or tobacco, such as cigarettes and e-cigarettes. If you need help quitting, ask your doctor.  Lose weight if you are overweight. Or, stay at a healthy weight as told by your doctor.  Eat a diet that is low in fat and cholesterol. If you need help, ask your doctor.  Exercise regularly. Ask your doctor for activities that are right for you. General instructions  Take over-the-counter and prescription medicines only as told by your doctor.  Take good care of your feet: ? Wear comfortable shoes that fit well. ? Check your feet often for any cuts or sores.  Keep all follow-up visits as told by your doctor This is important. Contact a doctor if:  You have cramps in your legs when you walk.  You have leg pain when you are at rest.  You have coldness in a leg or foot.  Your skin changes.  You are unable to get or have an erection (erectile dysfunction).  You have cuts or sores on your feet that do not heal. Get help right away if:  Your arm or leg turns cold, numb, and blue.  Your arms or legs become red, warm, swollen, painful, or numb.  You have chest pain.  You have trouble breathing.  You suddenly have weakness in your face, arm, or leg.  You become very  confused or you cannot speak.  You suddenly have a very bad headache.  You suddenly cannot see. Summary  Peripheral vascular disease (PVD) is a disease of the blood vessels.  A simple term for PVD is poor circulation. Without treatment, PVD tends to get worse.  Treatment may include exercise, low fat and low cholesterol diet, and quitting smoking. This information is not intended to replace advice given to you by your health care provider. Make sure you discuss any questions you have with your health care provider. Document Released: 02/17/2010 Document Revised: 11/05/2017 Document Reviewed: 12/31/2016 Elsevier Patient Education  2020 [employer] Inc.  

## 2019-11-10 NOTE — Assessment & Plan Note (Signed)
The patient has disabling claudication symptoms and signs of early rest pain on the right leg.  His previous interventions and surgery have all been on the left leg.  We will obtain duplex and ABIs in the near future at his convenience to get further improve patient about his disease process.  With a stent graft repair, he may need a diagnostic angiogram followed by either an arm or a pedal access depending on the locations of the lesions.  This is a difficult and challenging situation particularly given the previous aneurysm repair.  We will see him back with noninvasive studies in the near future.

## 2019-11-24 ENCOUNTER — Encounter (INDEPENDENT_AMBULATORY_CARE_PROVIDER_SITE_OTHER): Payer: Self-pay | Admitting: Nurse Practitioner

## 2019-11-24 ENCOUNTER — Ambulatory Visit (INDEPENDENT_AMBULATORY_CARE_PROVIDER_SITE_OTHER): Payer: Medicare HMO

## 2019-11-24 ENCOUNTER — Other Ambulatory Visit: Payer: Self-pay

## 2019-11-24 ENCOUNTER — Ambulatory Visit (INDEPENDENT_AMBULATORY_CARE_PROVIDER_SITE_OTHER): Payer: Medicare HMO | Admitting: Nurse Practitioner

## 2019-11-24 VITALS — BP 133/84 | HR 73 | Resp 17 | Ht 73.0 in | Wt 172.0 lb

## 2019-11-24 DIAGNOSIS — I10 Essential (primary) hypertension: Secondary | ICD-10-CM | POA: Diagnosis not present

## 2019-11-24 DIAGNOSIS — I70221 Atherosclerosis of native arteries of extremities with rest pain, right leg: Secondary | ICD-10-CM

## 2019-11-24 DIAGNOSIS — E782 Mixed hyperlipidemia: Secondary | ICD-10-CM | POA: Diagnosis not present

## 2019-11-24 DIAGNOSIS — F17200 Nicotine dependence, unspecified, uncomplicated: Secondary | ICD-10-CM

## 2019-11-25 ENCOUNTER — Encounter (INDEPENDENT_AMBULATORY_CARE_PROVIDER_SITE_OTHER): Payer: Self-pay | Admitting: Nurse Practitioner

## 2019-11-25 NOTE — Progress Notes (Signed)
SUBJECTIVE:  Patient ID: Clayton Gill, male    DOB: Jun 15, 1951, 68 y.o.   MRN: [id number] Chief Complaint  Patient presents with  . Follow-up    ultrasound    HPI  Clayton Gill is a 68 y.o. male that presents today for evaluation of PAD.  The patient has had a previous left leg bypass for severe PAD.  Recently he has begun to have some disabling claudication symptoms of the right lower extremity.  Currently he denies any pain in the left lower extremity.  Previous interventions were done at [facility].  He currently denies any ulceration or infection of the bilateral lower extremities.  Patient does endorse having some rest pain at times although is not consistent.  Today the patient underwent noninvasive studies which revealed that the right ABI 0.73 and the left is 1.17.  The patient also underwent a bilateral lower extremity arterial duplex.  The left lower extremity showed a patent left graft with string flow in the popliteal artery.  The right lower extremity has some dilation of the common femoral artery at 1.24 cm.  The patient has monophasic for low from the deep femoral down to the tibial arteries.  The distal SFA is also occluded with collateral flows.  The mid and proximal SFA show string sign flow.  Past Medical History:  Diagnosis Date  . Chronic hepatitis C ([place])   . GERD (gastroesophageal reflux disease)   . High cholesterol   . Hypertension   . Rectal bleeding     Past Surgical History:  Procedure Laterality Date  . ABDOMINAL AORTIC ANEURYSM REPAIR    . CHOLECYSTECTOMY    . COLONOSCOPY    . COLONOSCOPY WITH PROPOFOL N/A 08/20/2016   Procedure: COLONOSCOPY WITH PROPOFOL;  Surgeon: Lollie Sails, MD;  Location: [facility] ENDOSCOPY;  Service: Endoscopy;  Laterality: N/A;  . COLONOSCOPY WITH PROPOFOL N/A 11/06/2019   Procedure: COLONOSCOPY WITH PROPOFOL;  Surgeon: Jonathon Bellows, MD;  Location: [facility] ENDOSCOPY;  Service: Gastroenterology;  Laterality: N/A;  .  HEMORRHOIDECTOMY WITH HEMORRHOID BANDING    . HEMORROIDECTOMY    . surgery left leg      Social History   Socioeconomic History  . Marital status: Single    Spouse name: Not on file  . Number of children: Not on file  . Years of education: Not on file  . Highest education level: Not on file  Occupational History  . Not on file  Tobacco Use  . Smoking status: Current Every Day Smoker    Packs/day: 0.75    Years: 40.00    Pack years: 30.00    Types: Cigarettes  . Smokeless tobacco: Never Used  Substance and Sexual Activity  . Alcohol use: No  . Drug use: No  . Sexual activity: Not on file  Other Topics Concern  . Not on file  Social History Narrative   ** Merged History Encounter **       Social Determinants of Health   Financial Resource Strain:   . Difficulty of Paying Living Expenses: Not on file  Food Insecurity:   . Worried About Charity fundraiser in the Last Year: Not on file  . Ran Out of Food in the Last Year: Not on file  Transportation Needs:   . Lack of Transportation (Medical): Not on file  . Lack of Transportation (Non-Medical): Not on file  Physical Activity:   . Days of Exercise per Week: Not on file  . Minutes of Exercise per Session:  Not on file  Stress:   . Feeling of Stress : Not on file  Social Connections:   . Frequency of Communication with Friends and Family: Not on file  . Frequency of Social Gatherings with Friends and Family: Not on file  . Attends Religious Services: Not on file  . Active Member of Clubs or Organizations: Not on file  . Attends Archivist Meetings: Not on file  . Marital Status: Not on file  Intimate Partner Violence:   . Fear of Current or Ex-Partner: Not on file  . Emotionally Abused: Not on file  . Physically Abused: Not on file  . Sexually Abused: Not on file    Family History  Problem Relation Age of Onset  . Hypertension Mother   . Diabetes Mother     No Known Allergies   Review of  Systems   Review of Systems: Negative Unless Checked Constitutional: [] Weight loss  [] Fever  [] Chills Cardiac: [] Chest pain   []  Atrial Fibrillation  [] Palpitations   [] Shortness of breath when laying flat   [] Shortness of breath with exertion. [] Shortness of breath at rest Vascular:  [x] Pain in legs with walking   [] Pain in legs with standing [] Pain in legs when laying flat   [x] Claudication    [x] Pain in feet when laying flat    [] History of DVT   [] Phlebitis   [] Swelling in legs   [] Varicose veins   [] Non-healing ulcers Pulmonary:   [] Uses home oxygen   [] Productive cough   [] Hemoptysis   [] Wheeze  [] COPD   [] Asthma Neurologic:  [] Dizziness   [] Seizures  [] Blackouts [] History of stroke   [] History of TIA  [] Aphasia   [] Temporary Blindness   [] Weakness or numbness in arm   [] Weakness or numbness in leg Musculoskeletal:   [] Joint swelling   [] Joint pain   [] Low back pain  []  History of Knee Replacement [x] Arthritis [] back Surgeries  []  Spinal Stenosis    Hematologic:  [] Easy bruising  [] Easy bleeding   [] Hypercoagulable state   [] Anemic Gastrointestinal:  [] Diarrhea   [] Vomiting  [] Gastroesophageal reflux/heartburn   [] Difficulty swallowing. [] Abdominal pain Genitourinary:  [] Chronic kidney disease   [] Difficult urination  [] Anuric   [] Blood in urine [] Frequent urination  [] Burning with urination   [] Hematuria Skin:  [] Rashes   [] Ulcers [] Wounds Psychological:  [] History of anxiety   []  History of major depression  []  Memory Difficulties      OBJECTIVE:   Physical Exam  BP 133/84 (BP Location: Right Arm)   Pulse 73   Resp 17   Ht 6\' 1"  (1.854 m)   Wt 172 lb (78 kg)   BMI 22.69 kg/m   Gen: WD/WN, NAD Head: [place]/AT, No temporalis wasting.  Ear/Nose/Throat: Hearing grossly intact, nares w/o erythema or drainage Eyes: PER, EOMI, sclera nonicteric.  Neck: Supple, no masses.  No JVD.  Pulmonary:  Good air movement, no use of accessory muscles.  Cardiac: RRR Vascular:  Feet warm no  evidence of wounds Vessel Right Left  Radial Palpable Palpable  Dorsalis Pedis Not Palpable Palpable  Posterior Tibial Not Palpable Palpable   Gastrointestinal: soft, non-distended. No guarding/no peritoneal signs.  Musculoskeletal: M/S 5/5 throughout.  No deformity or atrophy.  Neurologic: Pain and light touch intact in extremities.  Symmetrical.  Speech is fluent. Motor exam as listed above. Psychiatric: Judgment intact, Mood & affect appropriate for pt's clinical situation. Dermatologic: No Venous rashes. No Ulcers Noted.  No changes consistent with cellulitis. Lymph : No Cervical lymphadenopathy, no  lichenification or skin changes of chronic lymphedema.       ASSESSMENT AND PLAN:  1. Atherosclerosis of native artery of right lower extremity with rest pain (HCC) Recommend:  The patient has evidence of severe atherosclerotic changes of both lower extremities with rest pain that is associated with preulcerative changes and impending tissue loss of the foot.  This represents a limb threatening ischemia and places the patient at the risk for limb loss.  Patient should undergo angiography of the right lower extremities with the hope for intervention for limb salvage.  The risks and benefits as well as the alternative therapies was discussed in detail with the patient.  All questions were answered.  Patient agrees to proceed with angiography.  The patient will follow up with me in the office after the procedure.       2. Mixed hyperlipidemia Continue statin as ordered and reviewed, no changes at this time   3. Essential (primary) hypertension Continue antihypertensive medications as already ordered, these medications have been reviewed and there are no changes at this time.   4. Tobacco use disorder Smoking cessation was discussed, 3-10 minutes spent on this topic specifically    Current Outpatient Medications on File Prior to Visit  Medication Sig Dispense Refill  .  amLODipine (NORVASC) 10 MG tablet Take 10 mg by mouth daily.    Marland Kitchen atorvastatin (LIPITOR) 40 MG tablet Take 40 mg by mouth daily.    . clopidogrel (PLAVIX) 75 MG tablet Take 75 mg by mouth daily.    . hydrochlorothiazide (HYDRODIURIL) 25 MG tablet Take 25 mg by mouth daily.    Marland Kitchen omeprazole (PRILOSEC) 20 MG capsule Take 20 mg by mouth daily.    . simvastatin (ZOCOR) 20 MG tablet Take 20 mg by mouth daily.    . hydrochlorothiazide (HYDRODIURIL) 25 MG tablet Take 25 mg by mouth daily.    Marland Kitchen lisinopril (PRINIVIL,ZESTRIL) 20 MG tablet Take 20 mg by mouth daily.    Marland Kitchen lisinopril (PRINIVIL,ZESTRIL) 40 MG tablet Take 40 mg by mouth daily.    . pantoprazole (PROTONIX) 40 MG tablet Take 40 mg by mouth daily.     No current facility-administered medications on file prior to visit.    There are no Patient Instructions on file for this visit. No follow-ups on file.   Kris Hartmann, NP  This note was completed with Sales executive.  Any errors are purely unintentional.

## 2019-11-27 ENCOUNTER — Telehealth (INDEPENDENT_AMBULATORY_CARE_PROVIDER_SITE_OTHER): Payer: Self-pay

## 2019-11-27 NOTE — Telephone Encounter (Signed)
Spoke with the patient and he is now scheduled with Dr. Lucky Cowboy for a RLE angio on 12/11/19 with a 6:45 am arrival time to the [facility]. Patient will do covid testing on 12/07/2019 before 12:00 pm at the [facility]. Pre-procedure instructions were discussed and will be mailed to the patient.

## 2019-12-07 ENCOUNTER — Other Ambulatory Visit
Admission: RE | Admit: 2019-12-07 | Discharge: 2019-12-07 | Disposition: A | Discharge status: Home / Self Care | Payer: Medicare HMO | Source: Ambulatory Visit | Attending: Vascular Surgery | Admitting: Vascular Surgery

## 2019-12-07 DIAGNOSIS — Z01812 Encounter for preprocedural laboratory examination: Secondary | ICD-10-CM | POA: Insufficient documentation

## 2019-12-07 DIAGNOSIS — Z20828 Contact with and (suspected) exposure to other viral communicable diseases: Secondary | ICD-10-CM | POA: Diagnosis not present

## 2019-12-07 LAB — SARS CORONAVIRUS 2 (TAT 6-24 HRS): SARS Coronavirus 2: NEGATIVE

## 2019-12-10 ENCOUNTER — Other Ambulatory Visit (INDEPENDENT_AMBULATORY_CARE_PROVIDER_SITE_OTHER): Payer: Self-pay | Admitting: Nurse Practitioner

## 2019-12-11 ENCOUNTER — Ambulatory Visit
Admission: RE | Admit: 2019-12-11 | Discharge: 2019-12-11 | Disposition: A | Discharge status: Home / Self Care | Payer: Medicare HMO | Attending: Vascular Surgery | Admitting: Vascular Surgery

## 2019-12-11 ENCOUNTER — Encounter: Payer: Self-pay | Admitting: Vascular Surgery

## 2019-12-11 ENCOUNTER — Other Ambulatory Visit: Payer: Self-pay

## 2019-12-11 ENCOUNTER — Telehealth (INDEPENDENT_AMBULATORY_CARE_PROVIDER_SITE_OTHER): Payer: Self-pay

## 2019-12-11 ENCOUNTER — Encounter
Admission: RE | Disposition: A | Discharge status: Home / Self Care | Payer: Self-pay | Source: Home / Self Care | Attending: Vascular Surgery

## 2019-12-11 DIAGNOSIS — I70221 Atherosclerosis of native arteries of extremities with rest pain, right leg: Secondary | ICD-10-CM

## 2019-12-11 DIAGNOSIS — Z7902 Long term (current) use of antithrombotics/antiplatelets: Secondary | ICD-10-CM | POA: Insufficient documentation

## 2019-12-11 DIAGNOSIS — I10 Essential (primary) hypertension: Secondary | ICD-10-CM | POA: Diagnosis not present

## 2019-12-11 DIAGNOSIS — E7800 Pure hypercholesterolemia, unspecified: Secondary | ICD-10-CM | POA: Insufficient documentation

## 2019-12-11 DIAGNOSIS — I70299 Other atherosclerosis of native arteries of extremities, unspecified extremity: Secondary | ICD-10-CM

## 2019-12-11 DIAGNOSIS — K219 Gastro-esophageal reflux disease without esophagitis: Secondary | ICD-10-CM | POA: Insufficient documentation

## 2019-12-11 DIAGNOSIS — F17210 Nicotine dependence, cigarettes, uncomplicated: Secondary | ICD-10-CM | POA: Diagnosis not present

## 2019-12-11 DIAGNOSIS — E782 Mixed hyperlipidemia: Secondary | ICD-10-CM | POA: Insufficient documentation

## 2019-12-11 DIAGNOSIS — K738 Other chronic hepatitis, not elsewhere classified: Secondary | ICD-10-CM | POA: Diagnosis not present

## 2019-12-11 DIAGNOSIS — Z79899 Other long term (current) drug therapy: Secondary | ICD-10-CM | POA: Diagnosis not present

## 2019-12-11 DIAGNOSIS — L97909 Non-pressure chronic ulcer of unspecified part of unspecified lower leg with unspecified severity: Secondary | ICD-10-CM

## 2019-12-11 HISTORY — PX: LOWER EXTREMITY ANGIOGRAPHY: CATH118251

## 2019-12-11 LAB — CREATININE, SERUM
Creatinine, Ser: 1.08 mg/dL (ref 0.61–1.24)
GFR calc Af Amer: >60 mL/min (ref >60)
GFR calc non Af Amer: >60 mL/min (ref >60)

## 2019-12-11 LAB — BUN: BUN: 19 mg/dL (ref 8–23)

## 2019-12-11 SURGERY — LOWER EXTREMITY ANGIOGRAPHY
Anesthesia: Moderate Sedation | Site: Leg Lower | Laterality: Right

## 2019-12-11 MED ORDER — DIPHENHYDRAMINE HCL 50 MG/ML IJ SOLN
INTRAMUSCULAR | Status: AC
  Filled 2019-12-11: qty 1

## 2019-12-11 MED ORDER — CEFAZOLIN SODIUM-DEXTROSE 2-4 GM/100ML-% IV SOLN
2.0000 g | Freq: Once | INTRAVENOUS | Status: AC
  Administered 2019-12-11: 2 g via INTRAVENOUS

## 2019-12-11 MED ORDER — IODIXANOL 320 MG/ML IV SOLN
INTRAVENOUS | Status: DC | PRN
  Administered 2019-12-11: 110 mL via INTRA_ARTERIAL

## 2019-12-11 MED ORDER — DIPHENHYDRAMINE HCL 50 MG/ML IJ SOLN: 50.0000 mg | Freq: Once | INTRAMUSCULAR | Status: DC | PRN

## 2019-12-11 MED ORDER — FENTANYL CITRATE (PF) 100 MCG/2ML IJ SOLN
INTRAMUSCULAR | Status: AC
  Filled 2019-12-11: qty 2

## 2019-12-11 MED ORDER — SODIUM CHLORIDE 0.9 % IV SOLN
INTRAVENOUS | Status: DC
  Administered 2019-12-11: 1000 mL via INTRAVENOUS

## 2019-12-11 MED ORDER — MIDAZOLAM HCL 2 MG/ML PO SYRP: 8.0000 mg | ORAL_SOLUTION | Freq: Once | ORAL | Status: DC | PRN

## 2019-12-11 MED ORDER — MIDAZOLAM HCL 5 MG/5ML IJ SOLN
INTRAMUSCULAR | Status: AC
  Filled 2019-12-11: qty 5

## 2019-12-11 MED ORDER — MIDAZOLAM HCL 2 MG/2ML IJ SOLN
INTRAMUSCULAR | Status: DC | PRN
  Administered 2019-12-11: 1 mg via INTRAVENOUS
  Administered 2019-12-11: 2 mg via INTRAVENOUS
  Administered 2019-12-11: 1 mg via INTRAVENOUS

## 2019-12-11 MED ORDER — HYDROMORPHONE HCL 1 MG/ML IJ SOLN: 1.0000 mg | Freq: Once | INTRAMUSCULAR | Status: DC | PRN

## 2019-12-11 MED ORDER — CEFAZOLIN SODIUM-DEXTROSE 2-4 GM/100ML-% IV SOLN
INTRAVENOUS | Status: AC
  Filled 2019-12-11: qty 100

## 2019-12-11 MED ORDER — ONDANSETRON HCL 4 MG/2ML IJ SOLN: 4.0000 mg | Freq: Four times a day (QID) | INTRAMUSCULAR | Status: DC | PRN

## 2019-12-11 MED ORDER — METHYLPREDNISOLONE SODIUM SUCC 125 MG IJ SOLR: 125.0000 mg | Freq: Once | INTRAMUSCULAR | Status: DC | PRN

## 2019-12-11 MED ORDER — DIPHENHYDRAMINE HCL 50 MG/ML IJ SOLN
INTRAMUSCULAR | Status: DC | PRN
  Administered 2019-12-11: 25 mg via INTRAVENOUS

## 2019-12-11 MED ORDER — HEPARIN SODIUM (PORCINE) 1000 UNIT/ML IJ SOLN
INTRAMUSCULAR | Status: DC | PRN
  Administered 2019-12-11: 3000 [IU] via INTRAVENOUS

## 2019-12-11 MED ORDER — FAMOTIDINE 20 MG PO TABS: 40.0000 mg | ORAL_TABLET | Freq: Once | ORAL | Status: DC | PRN

## 2019-12-11 MED ORDER — FENTANYL CITRATE (PF) 100 MCG/2ML IJ SOLN
INTRAMUSCULAR | Status: DC | PRN
  Administered 2019-12-11 (×3): 50 ug via INTRAVENOUS

## 2019-12-11 MED ORDER — HEPARIN SODIUM (PORCINE) 1000 UNIT/ML IJ SOLN
INTRAMUSCULAR | Status: AC
  Filled 2019-12-11: qty 1

## 2019-12-11 SURGICAL SUPPLY — 17 items
BALLN ULTRV 018 7X40X75 (BALLOONS)
BALLOON ULTRV 018 7X40X75 (BALLOONS) IMPLANT
CATH BEACON 5 .035 65 KMP TIP (CATHETERS) ×2 IMPLANT
CATH PIG 70CM (CATHETERS) ×2 IMPLANT
CATH VS15FR (CATHETERS) ×2 IMPLANT
DEVICE PRESTO INFLATION (MISCELLANEOUS) ×2 IMPLANT
DEVICE STARCLOSE SE CLOSURE (Vascular Products) ×2 IMPLANT
GLIDECATH 4FR STR (CATHETERS) ×2 IMPLANT
GLIDEWIRE ADV .035X260CM (WIRE) ×2 IMPLANT
GUIDEWIRE PFTE-COATED .018X300 (WIRE) ×2 IMPLANT
GUIDEWIRE SUPER STIFF .035X180 (WIRE) ×2 IMPLANT
PACK ANGIOGRAPHY (CUSTOM PROCEDURE TRAY) ×2 IMPLANT
SHEATH BRITE TIP 4FRX11 (SHEATH) ×2 IMPLANT
SHEATH BRITE TIP 5FRX11 (SHEATH) ×2 IMPLANT
SYR MEDRAD MARK 7 150ML (SYRINGE) ×2 IMPLANT
TUBING CONTRAST HIGH PRESS 72 (TUBING) ×2 IMPLANT
WIRE J 3MM .035X145CM (WIRE) ×2 IMPLANT

## 2019-12-11 NOTE — Telephone Encounter (Signed)
Spoke with the patient and he is now scheduled for a right leg angio pedal and femoral approach with Dr. Lucky Cowboy on 12/21/19 with a 7:30 am arrival time to the [facility]. Patient will do covid testing on 12/19/19 between 12:30-2:30 pm at the [facility]. Pre-procedure instruction were discussed and will be mailed to the patient.

## 2019-12-11 NOTE — H&P (Signed)
[facility] History & Physical Update  The patient was interviewed and re-examined.  The patient's previous History and Physical has been reviewed and is unchanged.  There is no change in the plan of care. We plan to proceed with the scheduled procedure.  Leotis Pain, MD  12/11/2019, 8:05 AM

## 2019-12-11 NOTE — Op Note (Signed)
[facility]  Percutaneous Study/Intervention Procedural Note   Date of Surgery: 12/11/2019  Surgeon(s): Lelar Farewell    Assistant s:none  Pre-operative Diagnosis: PAD with rest pain right leg  Post-operative diagnosis:  Same  Procedure(s) Performed:             1.  Ultrasound guidance for vascular access left femoral artery             2.  Catheter placement into right iliac artery from left femoral approach             3.  Aortogram and selective right and left lower extremity angiograms             4.  StarClose closure device left femoral artery  EBL: 10 cc  Contrast: 110 cc  Fluoro Time: 10.4 minutes  Moderate Conscious Sedation Time: approximately 45 minutes using 4 mg of Versed and 150 mcg of Fentanyl              Indications:  Patient is a 69 y.o.male with worsening claudication and now some rest pain symptoms on the right leg. The patient has noninvasive study showing monophasic flow with a reduction in the ABI on the right. The patient is brought in for angiography for further evaluation and potential treatment.  Due to the limb threatening nature of the situation, angiogram was performed for attempted limb salvage. The patient is aware that if the procedure fails, amputation would be expected.  The patient also understands that even with successful revascularization, amputation may still be required due to the severity of the situation. Risks and benefits are discussed and informed consent is obtained.   Procedure:  The patient was identified and appropriate procedural time out was performed.  The patient was then placed supine on the table and prepped and draped in the usual sterile fashion. Moderate conscious sedation was administered during a face to face encounter with the patient throughout the procedure with my supervision of the RN administering medicines and monitoring the patient's vital signs, pulse oximetry, telemetry and mental status throughout  from the start of the procedure until the patient was taken to the recovery room. Ultrasound was used to evaluate the left common femoral artery.  It was patent .  A digital ultrasound image was acquired.  A Seldinger needle was used to access the left common femoral artery under direct ultrasound guidance just above his previous bypass graft and a permanent image was performed.  A 0.035 J wire was advanced without resistance and a 5Fr sheath was placed.  Pigtail catheter was placed into the aorta and an AP aortogram was performed. This demonstrated the previous iliac branch stent graft with a iliac branch on the right which was patent.  The left limb of the graft was patent as was the left hypogastric artery.  The right external iliac artery just below the previously placed stent graft had a hyperplastic napkin ringlike lesion of greater than 75%.  The renal arteries had reasonably good flow although the right renal artery was small and may have had some degree of disease.  There was a suprarenal fixator proximally on the stent graft that was uncovered.  No flow was seen in the aneurysm. I then crossed the aortic bifurcation with a V S1 catheter and despite attempts with multiple wires and a glide catheter could only get the catheter into the proximal right iliac limb.  I could get a wire across the hyperplastic stenosis in the right  external iliac artery, but from the left femoral approach treatment of this lesion was impossible.  I decided to go ahead and perform selective right lower extremity angiogram through the V S1 catheter and the right iliac limb. This demonstrated diseased common femoral artery with a flush occlusion of the right SFA.  The SFA and popliteal arteries were chronically occluded with reconstitution of the posterior tibial artery is the only runoff to the foot.  I then removed the V S1 catheter.  I elected to perform selective left lower extremity imaging as there was some question of  stenosis below the bypass graft on his noninvasive studies.  This was reported as a femoral to popliteal bypass graft but on imaging, it was a femoral to posterior tibial bypass which was widely patent and the posterior tibial artery did not have any significant stenosis below the bypass.  I elected to terminate the procedure. The sheath was removed and StarClose closure device was deployed in the left femoral artery with excellent hemostatic result. The patient was taken to the recovery room in stable condition having tolerated the procedure well.  Findings:               Aortogram:  Previous iliac branch stent graft with a iliac branch on the right which was patent.  The left limb of the graft was patent as was the left hypogastric artery.  The right external iliac artery just below the previously placed stent graft had a hyperplastic napkin ringlike lesion of greater than 75%.  The renal arteries had reasonably good flow although the right renal artery was small and may have had some degree of disease.  There was a suprarenal fixator proximally on the stent graft that was uncovered.  No flow was seen in the aneurysm.             Right lower Extremity:  This demonstrated diseased common femoral artery with a flush occlusion of the right SFA.  The SFA and popliteal arteries were chronically occluded with reconstitution of the posterior tibial artery is the only runoff to the foot.  Left lower extremity: Mild disease of the left common femoral artery with a patent left common femoral to posterior tibial artery bypass without any significant stenoses identified.  The posterior tibial artery was reasonably good size and continuous into the foot.   Disposition: Patient was taken to the recovery room in stable condition having tolerated the procedure well.  Complications: None  Leotis Pain 12/11/2019 9:31 AM   This note was created with Dragon Medical transcription system. Any errors in dictation are purely  unintentional.

## 2019-12-12 ENCOUNTER — Encounter (INDEPENDENT_AMBULATORY_CARE_PROVIDER_SITE_OTHER): Payer: Self-pay

## 2019-12-19 ENCOUNTER — Other Ambulatory Visit
Admission: RE | Admit: 2019-12-19 | Discharge: 2019-12-19 | Disposition: A | Discharge status: Home / Self Care | Payer: Medicare HMO | Source: Ambulatory Visit | Attending: Vascular Surgery | Admitting: Vascular Surgery

## 2019-12-19 ENCOUNTER — Other Ambulatory Visit: Payer: Self-pay

## 2019-12-19 DIAGNOSIS — Z20822 Contact with and (suspected) exposure to covid-19: Secondary | ICD-10-CM | POA: Diagnosis not present

## 2019-12-19 DIAGNOSIS — Z01812 Encounter for preprocedural laboratory examination: Secondary | ICD-10-CM | POA: Diagnosis present

## 2019-12-19 LAB — SARS CORONAVIRUS 2 (TAT 6-24 HRS): SARS Coronavirus 2: NEGATIVE

## 2019-12-20 ENCOUNTER — Other Ambulatory Visit (INDEPENDENT_AMBULATORY_CARE_PROVIDER_SITE_OTHER): Payer: Self-pay | Admitting: Nurse Practitioner

## 2019-12-21 ENCOUNTER — Other Ambulatory Visit: Payer: Self-pay

## 2019-12-21 ENCOUNTER — Encounter
Admission: RE | Disposition: A | Discharge status: Home / Self Care | Payer: Self-pay | Source: Home / Self Care | Attending: Vascular Surgery

## 2019-12-21 ENCOUNTER — Ambulatory Visit
Admission: RE | Admit: 2019-12-21 | Discharge: 2019-12-21 | Disposition: A | Discharge status: Home / Self Care | Payer: Medicare HMO | Attending: Vascular Surgery | Admitting: Vascular Surgery

## 2019-12-21 ENCOUNTER — Encounter: Payer: Self-pay | Admitting: Vascular Surgery

## 2019-12-21 DIAGNOSIS — E7800 Pure hypercholesterolemia, unspecified: Secondary | ICD-10-CM | POA: Diagnosis not present

## 2019-12-21 DIAGNOSIS — E782 Mixed hyperlipidemia: Secondary | ICD-10-CM | POA: Insufficient documentation

## 2019-12-21 DIAGNOSIS — F17210 Nicotine dependence, cigarettes, uncomplicated: Secondary | ICD-10-CM | POA: Diagnosis not present

## 2019-12-21 DIAGNOSIS — K219 Gastro-esophageal reflux disease without esophagitis: Secondary | ICD-10-CM | POA: Insufficient documentation

## 2019-12-21 DIAGNOSIS — I10 Essential (primary) hypertension: Secondary | ICD-10-CM | POA: Diagnosis not present

## 2019-12-21 DIAGNOSIS — I70221 Atherosclerosis of native arteries of extremities with rest pain, right leg: Secondary | ICD-10-CM | POA: Diagnosis present

## 2019-12-21 DIAGNOSIS — I70219 Atherosclerosis of native arteries of extremities with intermittent claudication, unspecified extremity: Secondary | ICD-10-CM

## 2019-12-21 DIAGNOSIS — Z79899 Other long term (current) drug therapy: Secondary | ICD-10-CM | POA: Diagnosis not present

## 2019-12-21 DIAGNOSIS — K739 Chronic hepatitis, unspecified: Secondary | ICD-10-CM | POA: Diagnosis not present

## 2019-12-21 DIAGNOSIS — Z7902 Long term (current) use of antithrombotics/antiplatelets: Secondary | ICD-10-CM | POA: Diagnosis not present

## 2019-12-21 HISTORY — PX: LOWER EXTREMITY ANGIOGRAPHY: CATH118251

## 2019-12-21 LAB — CREATININE, SERUM
Creatinine, Ser: 1.25 mg/dL — ABNORMAL HIGH (ref 0.61–1.24)
GFR calc Af Amer: >60 mL/min (ref >60)
GFR calc non Af Amer: 60 mL/min — ABNORMAL LOW (ref >60)

## 2019-12-21 LAB — BUN: BUN: 18 mg/dL (ref 8–23)

## 2019-12-21 SURGERY — LOWER EXTREMITY ANGIOGRAPHY
Anesthesia: Moderate Sedation | Site: Leg Lower | Laterality: Right

## 2019-12-21 MED ORDER — ASPIRIN EC 81 MG PO TBEC
81.0000 mg | DELAYED_RELEASE_TABLET | Freq: Every day | ORAL | Status: DC
  Administered 2019-12-21: 14:00:00 81 mg via ORAL

## 2019-12-21 MED ORDER — ASPIRIN EC 81 MG PO TBEC: 81.0000 mg | DELAYED_RELEASE_TABLET | Freq: Every day | ORAL | 2 refills | Status: DC

## 2019-12-21 MED ORDER — SODIUM CHLORIDE 0.9 % IV SOLN: INTRAVENOUS | Status: AC

## 2019-12-21 MED ORDER — DIPHENHYDRAMINE HCL 50 MG/ML IJ SOLN
INTRAMUSCULAR | Status: AC
  Filled 2019-12-21: qty 1

## 2019-12-21 MED ORDER — DIPHENHYDRAMINE HCL 50 MG/ML IJ SOLN
INTRAMUSCULAR | Status: DC | PRN
  Administered 2019-12-21: 25 mg via INTRAVENOUS

## 2019-12-21 MED ORDER — HYDROMORPHONE HCL 1 MG/ML IJ SOLN: 1.0000 mg | Freq: Once | INTRAMUSCULAR | Status: DC | PRN

## 2019-12-21 MED ORDER — FENTANYL CITRATE (PF) 100 MCG/2ML IJ SOLN
INTRAMUSCULAR | Status: AC
  Filled 2019-12-21: qty 2

## 2019-12-21 MED ORDER — SODIUM CHLORIDE 0.9% FLUSH: 3.0000 mL | Freq: Two times a day (BID) | INTRAVENOUS | Status: DC

## 2019-12-21 MED ORDER — HEPARIN SODIUM (PORCINE) 1000 UNIT/ML IJ SOLN
INTRAMUSCULAR | Status: DC | PRN
  Administered 2019-12-21: 5000 [IU] via INTRAVENOUS

## 2019-12-21 MED ORDER — ONDANSETRON HCL 4 MG/2ML IJ SOLN: 4.0000 mg | Freq: Four times a day (QID) | INTRAMUSCULAR | Status: DC | PRN

## 2019-12-21 MED ORDER — MIDAZOLAM HCL 5 MG/5ML IJ SOLN
INTRAMUSCULAR | Status: AC
  Filled 2019-12-21: qty 5

## 2019-12-21 MED ORDER — MIDAZOLAM HCL 2 MG/ML PO SYRP: 8.0000 mg | ORAL_SOLUTION | Freq: Once | ORAL | Status: DC | PRN

## 2019-12-21 MED ORDER — METHYLPREDNISOLONE SODIUM SUCC 125 MG IJ SOLR: 125.0000 mg | Freq: Once | INTRAMUSCULAR | Status: DC | PRN

## 2019-12-21 MED ORDER — DIPHENHYDRAMINE HCL 50 MG/ML IJ SOLN: 50.0000 mg | Freq: Once | INTRAMUSCULAR | Status: DC | PRN

## 2019-12-21 MED ORDER — ASPIRIN EC 81 MG PO TBEC
DELAYED_RELEASE_TABLET | ORAL | Status: AC
  Filled 2019-12-21: qty 1

## 2019-12-21 MED ORDER — HEPARIN SODIUM (PORCINE) 1000 UNIT/ML IJ SOLN
INTRAMUSCULAR | Status: AC
  Filled 2019-12-21: qty 1

## 2019-12-21 MED ORDER — SODIUM CHLORIDE 0.9% FLUSH: 3.0000 mL | INTRAVENOUS | Status: DC | PRN

## 2019-12-21 MED ORDER — CEFAZOLIN SODIUM-DEXTROSE 2-4 GM/100ML-% IV SOLN
2.0000 g | Freq: Once | INTRAVENOUS | Status: AC
  Administered 2019-12-21: 2 g via INTRAVENOUS

## 2019-12-21 MED ORDER — IODIXANOL 320 MG/ML IV SOLN
INTRAVENOUS | Status: DC | PRN
  Administered 2019-12-21: 70 mL via INTRA_ARTERIAL

## 2019-12-21 MED ORDER — LABETALOL HCL 5 MG/ML IV SOLN: 10.0000 mg | INTRAVENOUS | Status: DC | PRN

## 2019-12-21 MED ORDER — MIDAZOLAM HCL 2 MG/2ML IJ SOLN
INTRAMUSCULAR | Status: DC | PRN
  Administered 2019-12-21 (×4): 1 mg via INTRAVENOUS
  Administered 2019-12-21: 2 mg via INTRAVENOUS

## 2019-12-21 MED ORDER — SODIUM CHLORIDE 0.9 % IV SOLN: INTRAVENOUS | Status: DC

## 2019-12-21 MED ORDER — CEFAZOLIN SODIUM-DEXTROSE 2-4 GM/100ML-% IV SOLN
INTRAVENOUS | Status: AC
  Filled 2019-12-21: qty 100

## 2019-12-21 MED ORDER — ACETAMINOPHEN 325 MG PO TABS: 650.0000 mg | ORAL_TABLET | ORAL | Status: DC | PRN

## 2019-12-21 MED ORDER — SODIUM CHLORIDE 0.9 % IV SOLN: 250.0000 mL | INTRAVENOUS | Status: DC | PRN

## 2019-12-21 MED ORDER — HYDRALAZINE HCL 20 MG/ML IJ SOLN: 5.0000 mg | INTRAMUSCULAR | Status: DC | PRN

## 2019-12-21 MED ORDER — FAMOTIDINE 20 MG PO TABS: 40.0000 mg | ORAL_TABLET | Freq: Once | ORAL | Status: DC | PRN

## 2019-12-21 MED ORDER — FENTANYL CITRATE (PF) 100 MCG/2ML IJ SOLN
INTRAMUSCULAR | Status: DC | PRN
  Administered 2019-12-21: 50 ug via INTRAVENOUS
  Administered 2019-12-21 (×5): 25 ug via INTRAVENOUS

## 2019-12-21 SURGICAL SUPPLY — 26 items
BALLN LUTONIX 018 4X150X130 (BALLOONS) ×3
BALLN LUTONIX AV 9X60X75 (BALLOONS) ×3
BALLN ULTRVRSE 3X150X150 (BALLOONS) ×3
BALLN ULTRVRSE 9X60X75 (BALLOONS) ×3
BALLOON LUTONIX 018 4X150X130 (BALLOONS) ×1 IMPLANT
BALLOON LUTONIX AV 9X60X75 (BALLOONS) ×1 IMPLANT
BALLOON ULTRVRSE 3X150X150 (BALLOONS) ×1 IMPLANT
BALLOON ULTRVRSE 9X60X75 (BALLOONS) ×1 IMPLANT
CANNULA 5F STIFF (CANNULA) ×3 IMPLANT
CATH BEACON 5 .035 65 KMP TIP (CATHETERS) ×3 IMPLANT
CATH BEACON 5 .038 100 VERT TP (CATHETERS) ×3 IMPLANT
CATH PIG 70CM (CATHETERS) ×3 IMPLANT
COVER PROBE U/S 5X48 (MISCELLANEOUS) ×3 IMPLANT
DEVICE PRESTO INFLATION (MISCELLANEOUS) ×3 IMPLANT
DEVICE RAD TR BAND REGULAR (VASCULAR PRODUCTS) ×3 IMPLANT
DEVICE STARCLOSE SE CLOSURE (Vascular Products) ×3 IMPLANT
DEVICE TORQUE .025-.038 (MISCELLANEOUS) ×3 IMPLANT
GLIDEWIRE ADV .035X260CM (WIRE) ×3 IMPLANT
GUIDEWIRE SUPER STIFF .035X180 (WIRE) ×3 IMPLANT
PACK ANGIOGRAPHY (CUSTOM PROCEDURE TRAY) ×3 IMPLANT
SHEATH BRITE TIP 5FRX11 (SHEATH) ×3 IMPLANT
SHEATH BRITE TIP 6FRX11 (SHEATH) ×3 IMPLANT
SHEATH HALO 035 5FRX10 (SHEATH) ×3 IMPLANT
WIRE G 018X200 V18 (WIRE) ×3 IMPLANT
WIRE G V18X300CM (WIRE) ×3 IMPLANT
WIRE J 3MM .035X145CM (WIRE) ×3 IMPLANT

## 2019-12-21 NOTE — H&P (Signed)
 [facility] History & Physical Update  The patient was interviewed and re-examined.  The patient's previous History and Physical has been reviewed and is unchanged.  There is no change in the plan of care. We plan to proceed with the scheduled procedure.  Leotis Pain, MD  12/21/2019, 8:42 AM

## 2019-12-21 NOTE — Op Note (Signed)
[facility]  Percutaneous Study/Intervention Procedural Note   Date of Surgery: 12/21/2019  Surgeon(s):DEW,JASON    Assistants:none  Pre-operative Diagnosis: PAD with rest Gill right leg  Post-operative diagnosis:  Same  Procedure(s) Performed:             1.  Ultrasound guidance for vascular access right femoral artery and right posterior tibial artery             2.  Catheter placement into right common femoral artery from right posterior tibial artery and into right iliac artery from right common femoral approach             3.  Aortogram and selective right lower extremity angiogram             4.  Percutaneous transluminal angioplasty of right external iliac artery with 9 mm diameter by 6 cm length angioplasty balloon             5.   Percutaneous transluminal angioplasty of the right tibioperoneal trunk and proximal posterior tibial artery with 3 mm diameter angioplasty balloon  6.  Percutaneous transluminal angioplasty of the right popliteal artery and distal SFA with 4 mm diameter with tonics drug-coated angioplasty balloon             7.  StarClose closure device right femoral artery  EBL: 10 cc  Contrast: 70 cc  Fluoro Time: 25 minutes  Moderate Conscious Sedation Time: approximately 100 minutes using 6 mg of Versed and 175 mcg of Fentanyl              Indications:  Patient is a 69 y.o.male with multilevel disease and rest Gill.  He has previously had an aortogram from the left femoral approach but his previous aortic stent graft precluded any up and over treatment. The patient is brought in for angiography for further evaluation and potential treatment.  Due to the limb threatening nature of the situation, angiogram was performed for attempted limb salvage. The patient is aware that if the procedure fails, amputation would be expected.  The patient also understands that even with successful revascularization, amputation may still be required due to  the severity of the situation.  Risks and benefits are discussed and informed consent is obtained.   Procedure:  The patient was identified and appropriate procedural time out was performed.  The patient was then placed supine on the table and prepped and draped in the usual sterile fashion. Moderate conscious sedation was administered during a face to face encounter with the patient throughout the procedure with my supervision of the RN administering medicines and monitoring the patient's vital signs, pulse oximetry, telemetry and mental status throughout from the start of the procedure until the patient was taken to the recovery room.  Initially, ultrasound was used to access the right posterior tibial artery with a micropuncture needle.  This was done under direct ultrasound guidance and a permanent image was recorded.  A 5 French halo sheath was then placed in the right posterior tibial artery.  The patient was then heparinized.  Imaging showed high-grade stenosis of greater than 80% proximal posterior tibial artery and tibioperoneal trunk with occlusion of the popliteal artery.  We previously knew that the SFA and popliteal arteries were occluded from his previous angiogram.  I was able to get across the stenosis in the posterior tibial artery and then the occlusion of the popliteal artery and get a catheter and wire up into the proximal SFA.  The popliteal artery had some heavy disease in the midsegment and this was somewhat tedious.  Multiple attempts were made to get into the true lumen of the common femoral artery through the flush occlusion of the SFA but were never able to get an intraluminal plane into the common femoral artery from below.  This was likely worsened by the severe disease in the common femoral artery which was seen to be at least 70%.  He had a known inflow lesion in the right external iliac artery of greater than 75% by previous imaging studies, so I elected to go ahead and access the  right common femoral artery to treat this inflow lesion.  Ultrasound was used to evaluate the right common femoral artery.  It was patent but heavily diseased.  A digital ultrasound image was acquired.  A Seldinger needle was used to access the right common femoral artery under direct ultrasound guidance and a permanent image was performed.  A 0.035 J wire was advanced without resistance and a 5Fr sheath was placed.   I then crossed the right external iliac lesion without difficulty parking a wire up into the aorta.  A 9 mm diameter by 6 cm length angioplasty balloon was inflated to 6 atm for 1 minute in the right external iliac artery.  Completion imaging showed only about a 15% residual stenosis after angioplasty.  Using images through this sheath, we tried to roadmap to cross the proximal SFA occlusion from the pedal access.  Multiple different angles and images were tried but were never able to gain true luminal access from below.  After placing 0.018 wire I did go ahead and treat the popliteal and pedal lesions and if he has continued symptoms would consider a femoral endarterectomy with either femoral to popliteal bypass or SFA/popliteal stents with concomitant femoral endarterectomy at a later date.  The right posterior tibial artery was treated with a 3 mm diameter by 15 cm length angioplasty balloon inflated to 8 atm for 1 minute.  The right popliteal artery and distal SFA were treated with a 4 mm diameter by 15 cm likely tonics drug-coated angioplasty balloon.  There was only about a 10 to 15% residual stenosis in the tibioperoneal trunk and proximal posterior tibial artery.  There was a 40% or so residual stenosis in the popliteal artery and the SFA above was occluded but would be addressed as described above later if his symptoms do not improve.  I elected to terminate the procedure. The sheath was removed and StarClose closure device was deployed in the right femoral artery with excellent hemostatic  result.  The pedal sheath was removed and pressure was held to the site with good hemostatic result.  The patient was taken to the recovery room in stable condition having tolerated the procedure well.  Findings:               Aortogram:  The right external iliac artery just below the previously placed stent graft had an 80% or so stenosis that was a napkin ringlike hyperplastic lesion.             Right lower Extremity:  The right common femoral artery was found to have at least a 70% stenosis with a flush occlusion of the right SFA below the common femoral lesion.  The profunda femoris artery was large and provided flow distally.  The posterior tibial artery had a high-grade stenosis in the proximal portion with occlusion of the tibioperoneal trunk, popliteal artery, and  SFA.   Disposition: Patient was taken to the recovery room in stable condition having tolerated the procedure well.  Complications: None  Clayton Gill 12/21/2019 11:08 AM   This note was created with Dragon Medical transcription system. Any errors in dictation are purely unintentional.

## 2019-12-22 ENCOUNTER — Encounter: Payer: Self-pay | Admitting: Cardiology

## 2020-01-04 ENCOUNTER — Other Ambulatory Visit (INDEPENDENT_AMBULATORY_CARE_PROVIDER_SITE_OTHER): Payer: Self-pay | Admitting: Vascular Surgery

## 2020-01-04 DIAGNOSIS — Z9862 Peripheral vascular angioplasty status: Secondary | ICD-10-CM

## 2020-01-05 ENCOUNTER — Encounter (INDEPENDENT_AMBULATORY_CARE_PROVIDER_SITE_OTHER): Payer: Self-pay | Admitting: Nurse Practitioner

## 2020-01-05 ENCOUNTER — Ambulatory Visit (INDEPENDENT_AMBULATORY_CARE_PROVIDER_SITE_OTHER): Payer: Medicare HMO

## 2020-01-05 ENCOUNTER — Other Ambulatory Visit: Payer: Self-pay

## 2020-01-05 ENCOUNTER — Ambulatory Visit (INDEPENDENT_AMBULATORY_CARE_PROVIDER_SITE_OTHER): Payer: Medicare HMO | Admitting: Nurse Practitioner

## 2020-01-05 VITALS — BP 125/79 | HR 77 | Resp 16 | Wt 165.4 lb

## 2020-01-05 DIAGNOSIS — I10 Essential (primary) hypertension: Secondary | ICD-10-CM

## 2020-01-05 DIAGNOSIS — E782 Mixed hyperlipidemia: Secondary | ICD-10-CM

## 2020-01-05 DIAGNOSIS — Z9862 Peripheral vascular angioplasty status: Secondary | ICD-10-CM

## 2020-01-05 DIAGNOSIS — I70221 Atherosclerosis of native arteries of extremities with rest pain, right leg: Secondary | ICD-10-CM | POA: Diagnosis not present

## 2020-01-08 ENCOUNTER — Encounter (INDEPENDENT_AMBULATORY_CARE_PROVIDER_SITE_OTHER): Payer: Self-pay | Admitting: Nurse Practitioner

## 2020-01-08 NOTE — Progress Notes (Signed)
SUBJECTIVE:  Patient ID: Clayton Gill, male    DOB: November 25, 1951, 69 y.o.   MRN: [id number] Chief Complaint  Patient presents with  . Follow-up    [facility] 2week rle angio    HPI  Clayton Gill is a 69 y.o. male The patient returns to the office for followup and review status post angiogram with intervention.  On 12/21/2019 the patient underwent angioplasty of the right external iliac artery in addition to angioplasty of the right tibioperoneal trunk and proximal posterior tibial artery.  There is also angioplasty of the right popliteal artery and distal SFA.  It was also noted that there was a 40% of some residual stenosis in the popliteal artery and the SFA above was occluded however repeated treatment with depend on the patient's symptoms.  The patient notes improvement in the lower extremity symptoms. No interval shortening of the patient's claudication distance or rest pain symptoms.  However the patient does note some claudication still.  Previous wounds have now healed.  No new ulcers or wounds have occurred since the last visit.  There have been no significant changes to the patient's overall health care.  The patient denies amaurosis fugax or recent TIA symptoms. There are no recent neurological changes noted. The patient denies history of DVT, PE or superficial thrombophlebitis. The patient denies recent episodes of angina or shortness of breath.   ABI's Rt=0.72 and Lt=1.27  (previous ABI's Rt=0.73 and Lt=1.17) Duplex US of the monophasic waveforms in the tibial arteries of the right lower extremity with biphasic/triphasic waveforms in the tibial arteries of the left lower extremity.  The left toe waveforms are normal with slightly dampened right digit waveforms.  Past Medical History:  Diagnosis Date  . Chronic hepatitis C ([place])   . GERD (gastroesophageal reflux disease)   . High cholesterol   . Hypertension   . Rectal bleeding     Past Surgical History:  Procedure  Laterality Date  . ABDOMINAL AORTIC ANEURYSM REPAIR    . CHOLECYSTECTOMY    . COLONOSCOPY    . COLONOSCOPY WITH PROPOFOL N/A 08/20/2016   Procedure: COLONOSCOPY WITH PROPOFOL;  Surgeon: Lollie Sails, MD;  Location: [facility] ENDOSCOPY;  Service: Endoscopy;  Laterality: N/A;  . COLONOSCOPY WITH PROPOFOL N/A 11/06/2019   Procedure: COLONOSCOPY WITH PROPOFOL;  Surgeon: Jonathon Bellows, MD;  Location: [facility] ENDOSCOPY;  Service: Gastroenterology;  Laterality: N/A;  . HEMORRHOIDECTOMY WITH HEMORRHOID BANDING    . HEMORROIDECTOMY    . LOWER EXTREMITY ANGIOGRAPHY Right 12/11/2019   Procedure: LOWER EXTREMITY ANGIOGRAPHY;  Surgeon: Algernon Huxley, MD;  Location: [facility];  Service: Cardiovascular;  Laterality: Right;  . LOWER EXTREMITY ANGIOGRAPHY Right 12/21/2019   Procedure: LOWER EXTREMITY ANGIOGRAPHY;  Surgeon: Algernon Huxley, MD;  Location: [place] [facility];  Service: Cardiovascular;  Laterality: Right;  . surgery left leg      Social History   Socioeconomic History  . Marital status: Single    Spouse name: Not on file  . Number of children: Not on file  . Years of education: Not on file  . Highest education level: Not on file  Occupational History  . Not on file  Tobacco Use  . Smoking status: Current Some Day Smoker    Packs/day: 0.75    Years: 40.00    Pack years: 30.00    Types: Cigarettes  . Smokeless tobacco: Never Used  Substance and Sexual Activity  . Alcohol use: No  . Drug use: No  . Sexual  activity: Not on file  Other Topics Concern  . Not on file  Social History Narrative   ** Merged History Encounter **       Social Determinants of Health   Financial Resource Strain:   . Difficulty of Paying Living Expenses: Not on file  Food Insecurity:   . Worried About Charity fundraiser in the Last Year: Not on file  . Ran Out of Food in the Last Year: Not on file  Transportation Needs:   . Lack of Transportation (Medical): Not on file  . Lack of Transportation  (Non-Medical): Not on file  Physical Activity:   . Days of Exercise per Week: Not on file  . Minutes of Exercise per Session: Not on file  Stress:   . Feeling of Stress : Not on file  Social Connections:   . Frequency of Communication with Friends and Family: Not on file  . Frequency of Social Gatherings with Friends and Family: Not on file  . Attends Religious Services: Not on file  . Active Member of Clubs or Organizations: Not on file  . Attends Archivist Meetings: Not on file  . Marital Status: Not on file  Intimate Partner Violence:   . Fear of Current or Ex-Partner: Not on file  . Emotionally Abused: Not on file  . Physically Abused: Not on file  . Sexually Abused: Not on file    Family History  Problem Relation Age of Onset  . Hypertension Mother   . Diabetes Mother     No Known Allergies   Review of Systems   Review of Systems: Negative Unless Checked Constitutional: [] Weight loss  [] Fever  [] Chills Cardiac: [] Chest pain   []  Atrial Fibrillation  [] Palpitations   [] Shortness of breath when laying flat   [] Shortness of breath with exertion. [] Shortness of breath at rest Vascular:  [] Pain in legs with walking   [] Pain in legs with standing [] Pain in legs when laying flat   [] Claudication    [] Pain in feet when laying flat    [] History of DVT   [] Phlebitis   [] Swelling in legs   [] Varicose veins   [] Non-healing ulcers Pulmonary:   [] Uses home oxygen   [] Productive cough   [] Hemoptysis   [] Wheeze  [] COPD   [] Asthma Neurologic:  [] Dizziness   [] Seizures  [] Blackouts [] History of stroke   [] History of TIA  [] Aphasia   [] Temporary Blindness   [] Weakness or numbness in arm   [] Weakness or numbness in leg Musculoskeletal:   [] Joint swelling   [] Joint pain   [] Low back pain  []  History of Knee Replacement [] Arthritis [] back Surgeries  []  Spinal Stenosis    Hematologic:  [] Easy bruising  [] Easy bleeding   [] Hypercoagulable state   [] Anemic Gastrointestinal:  [] Diarrhea    [] Vomiting  [x] Gastroesophageal reflux/heartburn   [] Difficulty swallowing. [] Abdominal pain Genitourinary:  [] Chronic kidney disease   [] Difficult urination  [] Anuric   [] Blood in urine [] Frequent urination  [] Burning with urination   [] Hematuria Skin:  [] Rashes   [] Ulcers [] Wounds Psychological:  [] History of anxiety   []  History of major depression  []  Memory Difficulties      OBJECTIVE:   Physical Exam  BP 125/79 (BP Location: Right Arm)   Pulse 77   Resp 16   Wt 165 lb 6.4 oz (75 kg)   BMI 21.82 kg/m   Gen: WD/WN, NAD Head: [place]/AT, No temporalis wasting.  Ear/Nose/Throat: Hearing grossly intact, nares w/o erythema or drainage Eyes: PER, EOMI,  sclera nonicteric.  Neck: Supple, no masses.  No JVD.  Pulmonary:  Good air movement, no use of accessory muscles.  Cardiac: RRR Vascular:  Vessel Right Left  Radial Palpable Palpable  Dorsalis Pedis Palpable Palpable  Posterior Tibial Palpable Palpable   Gastrointestinal: soft, non-distended. No guarding/no peritoneal signs.  Musculoskeletal: M/S 5/5 throughout.  No deformity or atrophy.  Neurologic: Pain and light touch intact in extremities.  Symmetrical.  Speech is fluent. Motor exam as listed above. Psychiatric: Judgment intact, Mood & affect appropriate for pt's clinical situation. Dermatologic: No Venous rashes. No Ulcers Noted.  No changes consistent with cellulitis. Lymph : No Cervical lymphadenopathy, no lichenification or skin changes of chronic lymphedema.       ASSESSMENT AND PLAN:  1. Atherosclerosis of native artery of right lower extremity with rest pain ([place])  Recommend:  The patient has evidence of atherosclerosis of the lower extremities with claudication.  The patient does not voice lifestyle limiting changes at this point in time.  I discussed with the patient if his claudication-like symptoms were enough to reintervene or if he wanted to see how well he is able to tolerate before intervening.  Patient  wishes not to proceed with intervention at this time unless pain begins to get unbearable.  Noninvasive studies do not suggest clinically significant change.  Patient advised to stop smoking as this can cause the need for reintervention, 3 to 10 minutes spent on this topic.  No invasive studies, angiography or surgery at this time The patient should continue walking and begin a more formal exercise program.  The patient should continue antiplatelet therapy and aggressive treatment of the lipid abnormalities the patient also did note that he is not taking his aspirin because he was unaware that he was supposed to take it.  Patient is advised today that he should be taking aspirin daily in addition to his Plavix.  No changes in the patient's medications at this time  The patient should continue wearing graduated compression socks 10-15 mmHg strength to control the mild edema.   Patient will follow up in 3 months with noninvasive studies.  2. Essential (primary) hypertension Continue antihypertensive medications as already ordered, these medications have been reviewed and there are no changes at this time.   3. Mixed hyperlipidemia Continue statin as ordered and reviewed, no changes at this time    Current Outpatient Medications on File Prior to Visit  Medication Sig Dispense Refill  . amLODipine (NORVASC) 10 MG tablet Take 10 mg by mouth daily.    Marland Kitchen aspirin EC 81 MG tablet Take 1 tablet (81 mg total) by mouth daily. 150 tablet 2  . atorvastatin (LIPITOR) 40 MG tablet Take 40 mg by mouth daily.    . clopidogrel (PLAVIX) 75 MG tablet Take 75 mg by mouth daily.    . hydrochlorothiazide (HYDRODIURIL) 25 MG tablet Take 25 mg by mouth daily.    . hydrochlorothiazide (HYDRODIURIL) 25 MG tablet Take 25 mg by mouth daily.    Marland Kitchen lisinopril (PRINIVIL,ZESTRIL) 20 MG tablet Take 20 mg by mouth daily.    Marland Kitchen lisinopril (PRINIVIL,ZESTRIL) 40 MG tablet Take 40 mg by mouth daily.    Marland Kitchen omeprazole  (PRILOSEC) 20 MG capsule Take 20 mg by mouth daily.    . pantoprazole (PROTONIX) 40 MG tablet Take 40 mg by mouth daily.    . simvastatin (ZOCOR) 20 MG tablet Take 20 mg by mouth daily.     No current facility-administered medications on file prior to  visit.    There are no Patient Instructions on file for this visit. No follow-ups on file.   Kris Hartmann, NP  This note was completed with Sales executive.  Any errors are purely unintentional.

## 2020-01-15 ENCOUNTER — Encounter: Payer: Self-pay | Admitting: Family Medicine

## 2020-03-05 ENCOUNTER — Ambulatory Visit (INDEPENDENT_AMBULATORY_CARE_PROVIDER_SITE_OTHER): Payer: Medicare HMO | Admitting: Gastroenterology

## 2020-03-05 ENCOUNTER — Other Ambulatory Visit: Payer: Self-pay

## 2020-03-05 ENCOUNTER — Encounter: Payer: Self-pay | Admitting: Gastroenterology

## 2020-03-05 VITALS — BP 117/82 | HR 73 | Temp 98.3°F | Ht 73.0 in | Wt 161.4 lb

## 2020-03-05 DIAGNOSIS — B182 Chronic viral hepatitis C: Secondary | ICD-10-CM | POA: Diagnosis not present

## 2020-03-05 DIAGNOSIS — K581 Irritable bowel syndrome with constipation: Secondary | ICD-10-CM

## 2020-03-05 MED ORDER — DICYCLOMINE HCL 10 MG PO CAPS: 10.0000 mg | ORAL_CAPSULE | Freq: Three times a day (TID) | ORAL | 5 refills | Status: DC

## 2020-03-05 NOTE — Patient Instructions (Signed)

## 2020-03-05 NOTE — Progress Notes (Signed)
Jonathon Bellows MD, MRCP(U.K) [address]  [place]  [place], [place] [address]  Main: [phone]  Fax: [phone]   Gastroenterology Consultation  Referring Provider:     Theotis Burrow* Primary Care Physician:  Theotis Burrow, MD Primary Gastroenterologist:  Dr. Jonathon Bellows  Reason for Consultation:    Abdominal pain        HPI:   Clayton Gill is a 69 y.o. y/o male referred for abdominal pain.  He was previously a patient of Dr. Gustavo Lah who was seen him in the past for hepatitis C and constipation.  Some mention about starting the patient on Harvoni in 2018 but no further encounters noted after that.  I performed a colonoscopy in November 2020 due to prior history of adenomatous polyps and I resected a 7 mm polyp in the sigmoid colon and plan was for him to return back in 5 yearsIn January 2020 hemoglobin 13.4 g with a platelet count of 128, albumin 4.6 normal AST ALT HbA1c of 5.8 Last set of labs in 2018 showed that he had a positive viral load for hepatitis C.  Presently referred for abdominal pain.  States that the pain is in the lower part of the abdomen.  On and off can last for days.  Usually relieved after bowel movement.  At times he does not have a bowel movement for a few days and then he has worse abdominal pain.  Denies any NSAID use.  Does consume some soda.  Has some gas and bloating.  Has not tried any over-the-counter medications for his constipation.  Past Medical History:  Diagnosis Date  . Chronic hepatitis C ([place])   . GERD (gastroesophageal reflux disease)   . High cholesterol   . Hypertension   . Rectal bleeding     Past Surgical History:  Procedure Laterality Date  . ABDOMINAL AORTIC ANEURYSM REPAIR    . CHOLECYSTECTOMY    . COLONOSCOPY    . COLONOSCOPY WITH PROPOFOL N/A 08/20/2016   Procedure: COLONOSCOPY WITH PROPOFOL;  Surgeon: Lollie Sails, MD;  Location[facility] ENDOSCOPY;  Service: Endoscopy;  Laterality: N/A;  .  COLONOSCOPY WITH PROPOFOL N/A 11/06/2019   Procedure: COLONOSCOPY WITH PROPOFOL;  Surgeon: Jonathon Bellows, MD;  Location: [facility] ENDOSCOPY;  Service: Gastroenterology;  Laterality: N/A;  . HEMORRHOIDECTOMY WITH HEMORRHOID BANDING    . HEMORROIDECTOMY    . LOWER EXTREMITY ANGIOGRAPHY Right 12/11/2019   Procedure: LOWER EXTREMITY ANGIOGRAPHY;  Surgeon: Algernon Huxley, MD;  Location: [place] [facility];  Service: Cardiovascular;  Laterality: Right;  . LOWER EXTREMITY ANGIOGRAPHY Right 12/21/2019   Procedure: LOWER EXTREMITY ANGIOGRAPHY;  Surgeon: Algernon Huxley, MD;  Location: [facility];  Service: Cardiovascular;  Laterality: Right;  . surgery left leg      Prior to Admission medications   Medication Sig Start Date End Date Taking? Authorizing Provider  amLODipine (NORVASC) 10 MG tablet Take 10 mg by mouth daily. 09/25/19   [provider]  aspirin EC 81 MG tablet Take 1 tablet (81 mg total) by mouth daily. 12/21/19   Algernon Huxley, MD  atorvastatin (LIPITOR) 40 MG tablet Take 40 mg by mouth daily.    [provider]  clopidogrel (PLAVIX) 75 MG tablet Take 75 mg by mouth daily.    [provider]  hydrochlorothiazide (HYDRODIURIL) 25 MG tablet Take 25 mg by mouth daily.    [provider]  hydrochlorothiazide (HYDRODIURIL) 25 MG tablet Take 25 mg by mouth daily.  [provider]  lisinopril (PRINIVIL,ZESTRIL) 20 MG tablet Take 20 mg by mouth daily.    [provider]  lisinopril (PRINIVIL,ZESTRIL) 40 MG tablet Take 40 mg by mouth daily.    [provider]  omeprazole (PRILOSEC) 20 MG capsule Take 20 mg by mouth daily. 09/25/19   [provider]  pantoprazole (PROTONIX) 40 MG tablet Take 40 mg by mouth daily.    [provider]  simvastatin (ZOCOR) 20 MG tablet Take 20 mg by mouth daily.    [provider]    Family History  Problem Relation Age of Onset  . Hypertension Mother   . Diabetes Mother       Social History   Tobacco Use  . Smoking status: Current Some Day Smoker    Packs/day: 0.75    Years: 40.00    Pack years: 30.00    Types: Cigarettes  . Smokeless tobacco: Never Used  Substance Use Topics  . Alcohol use: No  . Drug use: No    Allergies as of 03/05/2020  . (No Known Allergies)    Review of Systems:    All systems reviewed and negative except where noted in HPI.   Physical Exam:  There were no vitals taken for this visit. No LMP for male patient. Psych:  Alert and cooperative. Normal mood and affect. General:   Alert,  Well-developed, well-nourished, pleasant and cooperative in NAD Head:  Normocephalic and atraumatic. Abdomen:  Normal bowel sounds.  No bruits.  Soft, non-tender and non-distended without masses, hepatosplenomegaly or hernias noted.  No guarding or rebound tenderness.    Neurologic:  Alert and oriented x3;  grossly normal neurologically. Psych:  Alert and cooperative. Normal mood and affect.  Imaging Studies: No results found.  Assessment and Plan:   Clayton Gill is a 69 y.o. y/o male referred to see me for abdominal pain.  Previously a patient of Dr. Gustavo Lah.  Known to have hepatitis C but unclear from the last office notes as to present status.  Likely abdominal pain secondary to constipation: IBS C.  Plan 1.  Check immune status for hepatitis A, B, C.  Rule out cirrhosis of liver with elastography as noted to have a low platelet count. 2.  No NSAIDs, high-fiber diet.  Patient information provided on the same.  Commence on MiraLAX 1 capful daily.  Bentyl 4 times daily as he does have some pain right after eating meals.  Relieved after bowel movement.  If no better at next visit we will consider endoscopy and CAT scan..   Follow up in 6 weeks  Dr Jonathon Bellows MD,MRCP(U.K)

## 2020-03-12 LAB — COMPREHENSIVE METABOLIC PANEL
ALT: 19 IU/L (ref 0–44)
AST: 24 IU/L (ref 0–40)
Albumin/Globulin Ratio: 1.3 (ref 1.2–2.2)
Albumin: 4.5 g/dL (ref 3.8–4.8)
Alkaline Phosphatase: 76 IU/L (ref 39–117)
BUN/Creatinine Ratio: 12 (ref 10–24)
BUN: 15 mg/dL (ref 8–27)
Bilirubin Total: 0.5 mg/dL (ref 0.0–1.2)
CO2: 26 mmol/L (ref 20–29)
Calcium: 9.6 mg/dL (ref 8.6–10.2)
Chloride: 101 mmol/L (ref 96–106)
Creatinine, Ser: 1.28 mg/dL — ABNORMAL HIGH (ref 0.76–1.27)
GFR calc Af Amer: 67 mL/min/{1.73_m2} (ref >59)
GFR calc non Af Amer: 58 mL/min/{1.73_m2} — ABNORMAL LOW (ref >59)
Globulin, Total: 3.6 g/dL (ref 1.5–4.5)
Glucose: 102 mg/dL — ABNORMAL HIGH (ref 65–99)
Potassium: 3.5 mmol/L (ref 3.5–5.2)
Sodium: 142 mmol/L (ref 134–144)
Total Protein: 8.1 g/dL (ref 6.0–8.5)

## 2020-03-12 LAB — HEPATITIS C GENOTYPE

## 2020-03-12 LAB — HIV ANTIBODY (ROUTINE TESTING W REFLEX): HIV Screen 4th Generation wRfx: NONREACTIVE

## 2020-03-12 LAB — PROTIME-INR
INR: 1.1 (ref 0.9–1.2)
Prothrombin Time: 11.9 s (ref 9.1–12.0)

## 2020-03-12 LAB — HEPATITIS A ANTIBODY, TOTAL: hep A Total Ab: POSITIVE — AB

## 2020-03-12 LAB — HEPATITIS B SURFACE ANTIGEN: Hepatitis B Surface Ag: NEGATIVE

## 2020-04-01 ENCOUNTER — Other Ambulatory Visit (INDEPENDENT_AMBULATORY_CARE_PROVIDER_SITE_OTHER): Payer: Self-pay | Admitting: Nurse Practitioner

## 2020-04-01 ENCOUNTER — Other Ambulatory Visit (INDEPENDENT_AMBULATORY_CARE_PROVIDER_SITE_OTHER): Payer: Self-pay | Admitting: Vascular Surgery

## 2020-04-01 ENCOUNTER — Other Ambulatory Visit: Payer: Self-pay

## 2020-04-01 DIAGNOSIS — I70221 Atherosclerosis of native arteries of extremities with rest pain, right leg: Secondary | ICD-10-CM

## 2020-04-01 DIAGNOSIS — B182 Chronic viral hepatitis C: Secondary | ICD-10-CM

## 2020-04-01 DIAGNOSIS — Z9862 Peripheral vascular angioplasty status: Secondary | ICD-10-CM

## 2020-04-02 ENCOUNTER — Ambulatory Visit (INDEPENDENT_AMBULATORY_CARE_PROVIDER_SITE_OTHER): Payer: Medicare HMO

## 2020-04-02 ENCOUNTER — Ambulatory Visit (INDEPENDENT_AMBULATORY_CARE_PROVIDER_SITE_OTHER): Payer: Medicare HMO | Admitting: Nurse Practitioner

## 2020-04-02 ENCOUNTER — Encounter (INDEPENDENT_AMBULATORY_CARE_PROVIDER_SITE_OTHER): Payer: Self-pay | Admitting: Nurse Practitioner

## 2020-04-02 ENCOUNTER — Other Ambulatory Visit: Payer: Self-pay

## 2020-04-02 VITALS — BP 123/81 | HR 60 | Resp 16 | Ht 73.0 in | Wt 160.0 lb

## 2020-04-02 DIAGNOSIS — I10 Essential (primary) hypertension: Secondary | ICD-10-CM | POA: Diagnosis not present

## 2020-04-02 DIAGNOSIS — Z9862 Peripheral vascular angioplasty status: Secondary | ICD-10-CM | POA: Diagnosis not present

## 2020-04-02 DIAGNOSIS — I70221 Atherosclerosis of native arteries of extremities with rest pain, right leg: Secondary | ICD-10-CM

## 2020-04-02 DIAGNOSIS — I714 Abdominal aortic aneurysm, without rupture, unspecified: Secondary | ICD-10-CM

## 2020-04-05 ENCOUNTER — Encounter (INDEPENDENT_AMBULATORY_CARE_PROVIDER_SITE_OTHER): Payer: Self-pay | Admitting: Nurse Practitioner

## 2020-04-05 NOTE — Progress Notes (Signed)
Subjective:    Patient ID: Clayton Gill, male    DOB: 07-19-1951, 69 y.o.   MRN: [id number] Chief Complaint  Patient presents with  . Follow-up    ultrasound    Patient presents today to follow-up on noninvasive studies.  Patient's most recent intervention was on 12/21/2019.  Since that time the patient has been doing relatively well.  Unfortunately, the patient has an occlusion that was unable in the past endovascularly.  Based on this in order to intervene he will likely require a femoral endarterectomy with either stent placement for possible femoropopliteal bypass of his right lower extremity.  Currently the patient does endorse having some numbness and tingling as well some claudication-like symptoms.  He denies any rest pain.  He denies any cold foot.  He denies any discoloration of his lower extremity.  He denies any other signs symptoms of peripheral embolization.  Today noninvasive studies show a right ABI of 0.84 with a TBI 0.61.  The left has an ABI 1.21 with a TBI 0.91.  The patient has biphasic waveforms in the left tibial arteries with good toe waveforms.  The patient underwent an arterial duplex on his right lower extremity which revealed triphasic waveforms in the common femoral artery as well as the profunda.  The SFA is occluded in the proximal and distal sections however there is some flow within the mid SFA.  The popliteal has monophasic waveforms with an occlusion in the tibioperoneal trunk.  All tibial arteries have monophasic waveforms in the distal portion.  There is collateralization seen.   Review of Systems  Cardiovascular:       Claudication  Skin: Negative for color change.  All other systems reviewed and are negative.      Objective:   Physical Exam Vitals reviewed.  Constitutional:      Appearance: Normal appearance.  Cardiovascular:     Rate and Rhythm: Normal rate and regular rhythm.     Pulses:          Dorsalis pedis pulses are 1+ on the right  side and 2+ on the left side.       Posterior tibial pulses are 0 on the right side and 1+ on the left side.  Musculoskeletal:        General: Normal range of motion.     Right lower leg: No edema.     Left lower leg: No edema.  Skin:    General: Skin is warm and dry.  Neurological:     Mental Status: He is alert and oriented to person, place, and time.  Psychiatric:        Mood and Affect: Mood normal.        Behavior: Behavior normal.        Thought Content: Thought content normal.        Judgment: Judgment normal.     BP 123/81 (BP Location: Right Arm)   Pulse 60   Resp 16   Ht 6\' 1"  (1.854 m)   Wt 160 lb (72.6 kg)   BMI 21.11 kg/m   Past Medical History:  Diagnosis Date  . Chronic hepatitis C ([place])   . GERD (gastroesophageal reflux disease)   . High cholesterol   . Hypertension   . Rectal bleeding     Social History   Socioeconomic History  . Marital status: Single    Spouse name: Not on file  . Number of children: Not on file  . Years of education: Not on  file  . Highest education level: Not on file  Occupational History  . Not on file  Tobacco Use  . Smoking status: Current Some Day Smoker    Packs/day: 0.75    Years: 40.00    Pack years: 30.00    Types: Cigarettes  . Smokeless tobacco: Never Used  Substance and Sexual Activity  . Alcohol use: No  . Drug use: No  . Sexual activity: Not on file  Other Topics Concern  . Not on file  Social History Narrative   ** Merged History Encounter **       Social Determinants of Health   Financial Resource Strain:   . Difficulty of Paying Living Expenses:   Food Insecurity:   . Worried About Charity fundraiser in the Last Year:   . Arboriculturist in the Last Year:   Transportation Needs:   . Film/video editor (Medical):   Marland Kitchen Lack of Transportation (Non-Medical):   Physical Activity:   . Days of Exercise per Week:   . Minutes of Exercise per Session:   Stress:   . Feeling of Stress :   Social  Connections:   . Frequency of Communication with Friends and Family:   . Frequency of Social Gatherings with Friends and Family:   . Attends Religious Services:   . Active Member of Clubs or Organizations:   . Attends Archivist Meetings:   Marland Kitchen Marital Status:   Intimate Partner Violence:   . Fear of Current or Ex-Partner:   . Emotionally Abused:   Marland Kitchen Physically Abused:   . Sexually Abused:     Past Surgical History:  Procedure Laterality Date  . ABDOMINAL AORTIC ANEURYSM REPAIR    . CHOLECYSTECTOMY    . COLONOSCOPY    . COLONOSCOPY WITH PROPOFOL N/A 08/20/2016   Procedure: COLONOSCOPY WITH PROPOFOL;  Surgeon: Lollie Sails, MD;  Location: [facility] ENDOSCOPY;  Service: Endoscopy;  Laterality: N/A;  . COLONOSCOPY WITH PROPOFOL N/A 11/06/2019   Procedure: COLONOSCOPY WITH PROPOFOL;  Surgeon: Jonathon Bellows, MD;  Location: [facility] ENDOSCOPY;  Service: Gastroenterology;  Laterality: N/A;  . HEMORRHOIDECTOMY WITH HEMORRHOID BANDING    . HEMORROIDECTOMY    . LOWER EXTREMITY ANGIOGRAPHY Right 12/11/2019   Procedure: LOWER EXTREMITY ANGIOGRAPHY;  Surgeon: Algernon Huxley, MD;  Location: [place] CV LAB;  Service: Cardiovascular;  Laterality: Right;  . LOWER EXTREMITY ANGIOGRAPHY Right 12/21/2019   Procedure: LOWER EXTREMITY ANGIOGRAPHY;  Surgeon: Algernon Huxley, MD;  Location: [place] CV LAB;  Service: Cardiovascular;  Laterality: Right;  . surgery left leg      Family History  Problem Relation Age of Onset  . Hypertension Mother   . Diabetes Mother     No Known Allergies     Assessment & Plan:   1. Atherosclerosis of native artery of right lower extremity with rest pain [facility]) Despite the patient's occlusion of his SFA, he is doing relatively well.  He does continue to have some neuropathy as well as some claudication-like symptoms.  However the patient is able to tolerate his symptoms without significant issues.  Discussed intervention with the patient.  He does not wish to  proceed at this time.  He prefers to proceed with conservative therapy and close follow-up.  Patient is advised to continue with his aspirin and Plavix on a daily basis.  5 to 10 minutes were spent discussing smoking cessation and how it will be absolutely vital to slow his atherosclerotic disease progression.  We will continue smoking it is incredibly likely that the patient will ultimately need some sort of intervention.  We will have the patient follow-up in 6 months with noninvasive studies, however the patient is advised that if his lower extremity symptoms suddenly become worse, such as his leg turns cold, pale, worsening numbness or discoloration he should contact the office immediately for evaluation.  2. Abdominal aortic aneurysm without rupture [facility]) Previous records show that the patient had endovascular intervention to his aortic aneurysm at least 5 or 6 years ago.  Most recent study was in 2018 which showed no evidence of endoleak and his aneurysm had increased in size.  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  Since we have taken over the patient's care we have not obtained ultrasound imaging of his aorta.  We will discuss EVAR at the patient's next follow-up visit.  3. Essential (primary) hypertension Continue antihypertensive medications as already ordered, these medications have been reviewed and there are no changes at this time.    Current Outpatient Medications on File Prior to Visit  Medication Sig Dispense Refill  . amLODipine (NORVASC) 10 MG tablet Take 10 mg by mouth daily.    Marland Kitchen atorvastatin (LIPITOR) 40 MG tablet Take 40 mg by mouth daily.    . clopidogrel (PLAVIX) 75 MG tablet Take 75 mg by mouth daily.    Marland Kitchen dicyclomine (BENTYL) 10 MG capsule Take 1 capsule (10 mg total) by mouth 4 (four) times daily -  before meals and at bedtime. 120 capsule 5  . hydrochlorothiazide (HYDRODIURIL) 25 MG tablet Take 25 mg by mouth daily.     Marland Kitchen omeprazole (PRILOSEC) 20 MG capsule Take 20 mg by mouth daily.    . pantoprazole (PROTONIX) 40 MG tablet Take 40 mg by mouth daily.    . simvastatin (ZOCOR) 20 MG tablet Take 20 mg by mouth daily.    Marland Kitchen aspirin EC 81 MG tablet Take 1 tablet (81 mg total) by mouth daily. (Patient not taking: Reported on 03/05/2020) 150 tablet 2  . hydrochlorothiazide (HYDRODIURIL) 25 MG tablet Take 25 mg by mouth daily.    Marland Kitchen lisinopril (PRINIVIL,ZESTRIL) 20 MG tablet Take 20 mg by mouth daily.    Marland Kitchen lisinopril (PRINIVIL,ZESTRIL) 40 MG tablet Take 40 mg by mouth daily.     No current facility-administered medications on file prior to visit.    There are no Patient Instructions on file for this visit. No follow-ups on file.   Kris Hartmann, NP

## 2020-04-18 ENCOUNTER — Ambulatory Visit: Payer: Medicare HMO | Admitting: Gastroenterology

## 2020-04-18 NOTE — Progress Notes (Deleted)
Jonathon Bellows MD, MRCP(U.K) [address]  [place]  [place],  [address]  Main: [phone]  Fax: [phone]   Primary Care Physician: Theotis Burrow, MD  Primary Gastroenterologist:  Dr. Jonathon Bellows    Hepatitis C follow-up  HPI: Clayton Gill is a 69 y.o. male    Summary of history : Initially referred and seen on 03/05/2020 for abdominal pain.He was previously a patient of Dr. Gustavo Lah who was seen him in the past for hepatitis C and constipation.  Some mention about starting the patient on Harvoni in 2018 but no further encounters noted after that. States that the pain is in the lower part of the abdomen.  On and off can last for days.  Usually relieved after bowel movement.  At times he does not have a bowel movement for a few days and then he has worse abdominal pain.  Denies any NSAID use.  Does consume some soda.  Has some gas and bloating.  Has not tried any over-the-counter medications for his constipation.   I performed a colonoscopy in November 2020 due to prior history of adenomatous polyps and I resected a 7 mm polyp in the sigmoid colon and plan was for him to return back in 5 yearsIn January 2020 hemoglobin 13.4 g with a platelet count of 128, albumin 4.6 normal AST ALT HbA1c of 5.8.Last set of labs in 2018 showed that he had a positive viral load for hepatitis C.  Interval history   03/05/2020-04/18/2020  03/05/2020: Hepatitis C viral load was ordered but for some reason was not checked.  CMP demonstrates normal liver function test.  HIV nonreactive.  Hepatitis C genotype was ordered but could not be performed as it mentions that they blood sample had insufficient hepatitis C virus RNA.  Hepatitis B surface antigen was negative hepatitis a total antibody was positive PT/INR was 1.1.  USG elastography was ordered but was not performed.  Current Outpatient Medications  Medication Sig Dispense Refill  . amLODipine (NORVASC) 10 MG tablet Take 10 mg  by mouth daily.    Marland Kitchen aspirin EC 81 MG tablet Take 1 tablet (81 mg total) by mouth daily. (Patient not taking: Reported on 03/05/2020) 150 tablet 2  . atorvastatin (LIPITOR) 40 MG tablet Take 40 mg by mouth daily.    . clopidogrel (PLAVIX) 75 MG tablet Take 75 mg by mouth daily.    Marland Kitchen dicyclomine (BENTYL) 10 MG capsule Take 1 capsule (10 mg total) by mouth 4 (four) times daily -  before meals and at bedtime. 120 capsule 5  . hydrochlorothiazide (HYDRODIURIL) 25 MG tablet Take 25 mg by mouth daily.    . hydrochlorothiazide (HYDRODIURIL) 25 MG tablet Take 25 mg by mouth daily.    Marland Kitchen lisinopril (PRINIVIL,ZESTRIL) 20 MG tablet Take 20 mg by mouth daily.    Marland Kitchen lisinopril (PRINIVIL,ZESTRIL) 40 MG tablet Take 40 mg by mouth daily.    Marland Kitchen omeprazole (PRILOSEC) 20 MG capsule Take 20 mg by mouth daily.    . pantoprazole (PROTONIX) 40 MG tablet Take 40 mg by mouth daily.    . simvastatin (ZOCOR) 20 MG tablet Take 20 mg by mouth daily.     No current facility-administered medications for this visit.    Allergies as of 04/18/2020  . (No Known Allergies)    ROS:  General: Negative for anorexia, weight loss, fever, chills, fatigue, weakness. ENT: Negative for hoarseness, difficulty swallowing , nasal congestion. CV: Negative for chest pain, angina, palpitations, dyspnea on  exertion, peripheral edema.  Respiratory: Negative for dyspnea at rest, dyspnea on exertion, cough, sputum, wheezing.  GI: See history of present illness. GU:  Negative for dysuria, hematuria, urinary incontinence, urinary frequency, nocturnal urination.  Endo: Negative for unusual weight change.    Physical Examination:   There were no vitals taken for this visit.  General: Well-nourished, well-developed in no acute distress.  Eyes: No icterus. Conjunctivae pink. Mouth: Oropharyngeal mucosa moist and pink , no lesions erythema or exudate. Lungs: Clear to auscultation bilaterally. Non-labored. Heart: Regular rate and rhythm, no  murmurs rubs or gallops.  Abdomen: Bowel sounds are normal, nontender, nondistended, no hepatosplenomegaly or masses, no abdominal bruits or hernia , no rebound or guarding.   Extremities: No lower extremity edema. No clubbing or deformities. Neuro: Alert and oriented x 3.  Grossly intact. Skin: Warm and dry, no jaundice.   Psych: Alert and cooperative, normal mood and affect.   Imaging Studies: VAS [place] ABI WITH/WO TBI  Result Date: 04/09/2020 LOWER EXTREMITY DOPPLER STUDY Indications: Peripheral artery disease.  Vascular Interventions: 12/11/2019 Bilateral LE angio.                         12/21/2019 PTA of right EIA, PTA of right tibioperoneal                         trunk and proximal PTA. PTA of right popliteal artery                         and distal SFA. Comparison Study: 01/05/2020 Performing Technologist: Concha Norway RVT  Examination Guidelines: A complete evaluation includes at minimum, Doppler waveform signals and systolic blood pressure reading at the level of bilateral brachial, anterior tibial, and posterior tibial arteries, when vessel segments are accessible. Bilateral testing is considered an integral part of a complete examination. Photoelectric Plethysmograph (PPG) waveforms and toe systolic pressure readings are included as required and additional duplex testing as needed. Limited examinations for reoccurring indications may be performed as noted.  ABI Findings: +---------+------------------+-----+----------+--------+ Right    Rt Pressure (mmHg)IndexWaveform  Comment  +---------+------------------+-----+----------+--------+ Brachial 103                                       +---------+------------------+-----+----------+--------+ ATA      85                0.81 monophasic         +---------+------------------+-----+----------+--------+ PTA      88                0.84 monophasic         +---------+------------------+-----+----------+--------+ Great Toe64                 0.61 Abnormal           +---------+------------------+-----+----------+--------+ +---------+------------------+-----+--------+-------+ Left     Lt Pressure (mmHg)IndexWaveformComment +---------+------------------+-----+--------+-------+ Brachial 105                                    +---------+------------------+-----+--------+-------+ ATA      123               1.17 biphasic        +---------+------------------+-----+--------+-------+ PTA      127  1.21 biphasic        +---------+------------------+-----+--------+-------+ Great Toe102               0.97 Normal          +---------+------------------+-----+--------+-------+ +-------+-----------+-----------+------------+------------+ ABI/TBIToday's ABIToday's TBIPrevious ABIPrevious TBI +-------+-----------+-----------+------------+------------+ Right  .84        .61        .72                      +-------+-----------+-----------+------------+------------+ Left   1.21       .97        1.28                     +-------+-----------+-----------+------------+------------+ Right ABIs appear increased compared to prior study on 01/04/2020.  Summary: Right: Resting right ankle-brachial index indicates mild right lower extremity arterial disease. The right toe-brachial index is abnormal. Left: Resting left ankle-brachial index is within normal range. No evidence of significant left lower extremity arterial disease. The left toe-brachial index is normal.  *See table(s) above for measurements and observations.  Electronically signed by Leotis Pain MD on 04/09/2020 at 10:09:31 AM.    Final    VAS [place] LOWER EXTREMITY ARTERIAL DUPLEX  Result Date: 04/09/2020 LOWER EXTREMITY ARTERIAL DUPLEX STUDY  Current ABI: rt = 1.21; lt = .84 Performing Technologist: Concha Norway RVT  Examination Guidelines: A complete evaluation includes B-mode imaging, spectral Doppler, color Doppler, and power Doppler as needed of all  accessible portions of each vessel. Bilateral testing is considered an integral part of a complete examination. Limited examinations for reoccurring indications may be performed as noted.  +-----------+--------+-----+--------+----------+---------------+ RIGHT      PSV cm/sRatioStenosisWaveform  Comments        +-----------+--------+-----+--------+----------+---------------+ CFA Mid    52                   triphasic                 +-----------+--------+-----+--------+----------+---------------+ DFA        48                   triphasic                 +-----------+--------+-----+--------+----------+---------------+ SFA Prox                occluded                          +-----------+--------+-----+--------+----------+---------------+ SFA Mid    36                   monophasic                +-----------+--------+-----+--------+----------+---------------+ SFA Distal              occluded                          +-----------+--------+-----+--------+----------+---------------+ POP Distal 35                   monophasic                +-----------+--------+-----+--------+----------+---------------+ TP Trunk                occluded                          +-----------+--------+-----+--------+----------+---------------+ ATA  Distal 13                   monophasiccollateral seen +-----------+--------+-----+--------+----------+---------------+ PTA Distal 30                   monophasic                +-----------+--------+-----+--------+----------+---------------+ PERO Distal14                   monophasic                +-----------+--------+-----+--------+----------+---------------+  Summary: Right: Right Proximal and distal SFA occluded. Mid SFA shows minimal flow from collateral feed. Popliteal is patent with TP trunk then occluded. Distal PTA is patent. Distal ATA is fed by collateral.  See table(s) above for measurements and observations.  Electronically signed by Leotis Pain MD on 04/09/2020 at 10:09:34 AM.    Final     Assessment and Plan:   Goldman Cullom is a 69 y.o. y/o male here to follow-up for abdominal pain.  Previously a patient of Dr. Gustavo Lah.  Known to have hepatitis C but unclear from the last office notes as to present status.  Likely abdominal pain secondary to constipation: IBS C.  Plan 1.  Check immune status to his hepatitis B, check hepatitis C viral load. Rule out cirrhosis of liver with elastography as noted to have a low platelet count. 2.  No NSAIDs, high-fiber diet.  Patient information provided on the same.  Commence on MiraLAX 1 capful daily.  Bentyl 4 times daily as he does have some pain right after eating meals.  Relieved after bowel movement.  If no better at next visit we will consider endoscopy and CAT scan..  Dr Jonathon Bellows  MD,MRCP [facility]) Follow up in ***

## 2020-04-30 ENCOUNTER — Other Ambulatory Visit: Payer: Self-pay

## 2020-05-02 LAB — COMPREHENSIVE METABOLIC PANEL
ALT: 14 IU/L (ref 0–44)
AST: 20 IU/L (ref 0–40)
Albumin/Globulin Ratio: 1.2 (ref 1.2–2.2)
Albumin: 4.3 g/dL (ref 3.8–4.8)
Alkaline Phosphatase: 70 IU/L (ref 48–121)
BUN/Creatinine Ratio: 11 (ref 10–24)
BUN: 12 mg/dL (ref 8–27)
Bilirubin Total: 0.4 mg/dL (ref 0.0–1.2)
CO2: 25 mmol/L (ref 20–29)
Calcium: 9.7 mg/dL (ref 8.6–10.2)
Chloride: 98 mmol/L (ref 96–106)
Creatinine, Ser: 1.12 mg/dL (ref 0.76–1.27)
GFR calc Af Amer: 78 mL/min/{1.73_m2} (ref >59)
GFR calc non Af Amer: 68 mL/min/{1.73_m2} (ref >59)
Globulin, Total: 3.5 g/dL (ref 1.5–4.5)
Glucose: 88 mg/dL (ref 65–99)
Potassium: 3.5 mmol/L (ref 3.5–5.2)
Sodium: 139 mmol/L (ref 134–144)
Total Protein: 7.8 g/dL (ref 6.0–8.5)

## 2020-05-02 LAB — HEPATITIS C VRS RNA DETECT BY PCR-QUAL: HCV RNA NAA Qualitative: NEGATIVE

## 2020-05-07 ENCOUNTER — Encounter: Payer: Self-pay | Admitting: Gastroenterology

## 2020-06-05 ENCOUNTER — Other Ambulatory Visit: Payer: Self-pay

## 2020-06-05 ENCOUNTER — Ambulatory Visit (INDEPENDENT_AMBULATORY_CARE_PROVIDER_SITE_OTHER): Payer: Medicare HMO | Admitting: Gastroenterology

## 2020-06-05 VITALS — BP 124/77 | HR 57 | Temp 97.7°F | Ht 73.0 in | Wt 161.0 lb

## 2020-06-05 DIAGNOSIS — D696 Thrombocytopenia, unspecified: Secondary | ICD-10-CM

## 2020-06-05 DIAGNOSIS — Z8619 Personal history of other infectious and parasitic diseases: Secondary | ICD-10-CM | POA: Diagnosis not present

## 2020-06-05 DIAGNOSIS — K581 Irritable bowel syndrome with constipation: Secondary | ICD-10-CM | POA: Diagnosis not present

## 2020-06-05 NOTE — Progress Notes (Signed)
Jonathon Bellows MD, MRCP(U.K) [address]  [place]  [place], [place] [address]  Main: [phone]  Fax: [phone]   Primary Care Physician: Theotis Burrow, MD  Primary Gastroenterologist:  Dr. Jonathon Bellows    Hepatitis C follow-up, constipation   HPI: Clayton Gill is a 69 y.o. male    Summary of history :  Initially referred and seen in March 2021 for abdominal pain.He was previously a patient of Dr. Gustavo Lah who was seen him in the past for hepatitis C and constipation.  Some mention about starting the patient on Harvoni in 2018 but no further encounters noted after that.  I performed a colonoscopy in November 2020 due to prior history of adenomatous polyps and I resected a 7 mm polyp in the sigmoid colon and plan was for him to return back in 5 years.Last set of labs in 2018 showed that he had a positive viral load for hepatitis C. In January 2020 hemoglobin 13.4 g with a platelet count of 128, albumin 4.6 normal AST ALT HbA1c of 5.8 At his initial visit he stated that his abdominal pain was at the lower part of his abdomen.  On and off can last for days.  Usually relieved after bowel movement.  At times he does not have a bowel movement for a few days and then he has worse abdominal pain.  Denies any NSAID use.  Does consume some soda.  Has some gas and bloating.  Has not tried any over-the-counter medications for his constipation.   Interval history 330 2021-630 2021  330 2021-creatinine 1.28, LFTs normal, INR 1.1, hepatitis A total antibody positive, hepatitis B surface antigen negative, HIV negative,. Hepatitis C virus RNA negative. Doing well but constipation not better with the MiraLAX. Current Outpatient Medications  Medication Sig Dispense Refill   amLODipine (NORVASC) 10 MG tablet Take 10 mg by mouth daily.     aspirin EC 81 MG tablet Take 1 tablet (81 mg total) by mouth daily. (Patient not taking: Reported on 03/05/2020) 150 tablet 2   atorvastatin  (LIPITOR) 40 MG tablet Take 40 mg by mouth daily.     clopidogrel (PLAVIX) 75 MG tablet Take 75 mg by mouth daily.     dicyclomine (BENTYL) 10 MG capsule Take 1 capsule (10 mg total) by mouth 4 (four) times daily -  before meals and at bedtime. 120 capsule 5   hydrochlorothiazide (HYDRODIURIL) 25 MG tablet Take 25 mg by mouth daily.     hydrochlorothiazide (HYDRODIURIL) 25 MG tablet Take 25 mg by mouth daily.     lisinopril (PRINIVIL,ZESTRIL) 20 MG tablet Take 20 mg by mouth daily.     lisinopril (PRINIVIL,ZESTRIL) 40 MG tablet Take 40 mg by mouth daily.     omeprazole (PRILOSEC) 20 MG capsule Take 20 mg by mouth daily.     pantoprazole (PROTONIX) 40 MG tablet Take 40 mg by mouth daily.     simvastatin (ZOCOR) 20 MG tablet Take 20 mg by mouth daily.     No current facility-administered medications for this visit.    Allergies as of 06/05/2020   (No Known Allergies)    ROS:  General: Negative for anorexia, weight loss, fever, chills, fatigue, weakness. ENT: Negative for hoarseness, difficulty swallowing , nasal congestion. CV: Negative for chest pain, angina, palpitations, dyspnea on exertion, peripheral edema.  Respiratory: Negative for dyspnea at rest, dyspnea on exertion, cough, sputum, wheezing.  GI: See history of present illness. GU:  Negative for dysuria, hematuria, urinary  incontinence, urinary frequency, nocturnal urination.  Endo: Negative for unusual weight change.    Physical Examination:   There were no vitals taken for this visit.  General: Well-nourished, well-developed in no acute distress.  Eyes: No icterus. Conjunctivae pink. Mouth: Oropharyngeal mucosa moist and pink , no lesions erythema or exudate. Lungs: Clear to auscultation bilaterally. Non-labored. Heart: Regular rate and rhythm, no murmurs rubs or gallops.  Abdomen: Bowel sounds are normal, nontender, nondistended, no hepatosplenomegaly or masses, no abdominal bruits or hernia , no rebound or  guarding.   Extremities: No lower extremity edema. No clubbing or deformities. Neuro: Alert and oriented x 3.  Grossly intact. Skin: Warm and dry, no jaundice.   Psych: Alert and cooperative, normal mood and affect.   Imaging Studies: No results found.  Assessment and Plan:   Clayton Gill is a 69 y.o. y/o male here to follow-up for abdominal pain and prior history of hepatitis C. Abdominal pain likely due to irritable bowel syndrome with constipation. He is negative for hepatitis C. At the last visit I ordered a liver elastography for some reason was not done. This was ordered to rule out cirrhosis and if he does have it he would require long-term follow-up.    Plan 1.  Check hepatitis B surface antibody, liver elastography, CBC 2. Counseled with constipation MiraLAX has not worked. Will commence him on Linzess 145 mcg samples.  Dr Jonathon Bellows  MD,MRCP [facility]) Follow up in 4 weeks

## 2020-06-06 LAB — CBC WITH DIFFERENTIAL
Basophils Absolute: 0 10*3/uL (ref 0.0–0.2)
Basos: 0 %
EOS (ABSOLUTE): 0.1 10*3/uL (ref 0.0–0.4)
Eos: 2 %
Hematocrit: 40.5 % (ref 37.5–51.0)
Hemoglobin: 13.6 g/dL (ref 13.0–17.7)
Immature Grans (Abs): 0 10*3/uL (ref 0.0–0.1)
Immature Granulocytes: 0 %
Lymphocytes Absolute: 1.7 10*3/uL (ref 0.7–3.1)
Lymphs: 34 %
MCH: 30.3 pg (ref 26.6–33.0)
MCHC: 33.6 g/dL (ref 31.5–35.7)
MCV: 90 fL (ref 79–97)
Monocytes Absolute: 0.4 10*3/uL (ref 0.1–0.9)
Monocytes: 9 %
Neutrophils Absolute: 2.8 10*3/uL (ref 1.4–7.0)
Neutrophils: 55 %
RBC: 4.49 x10E6/uL (ref 4.14–5.80)
RDW: 12.8 % (ref 11.6–15.4)
WBC: 5.1 10*3/uL (ref 3.4–10.8)

## 2020-06-06 LAB — HEPATITIS B SURFACE ANTIBODY,QUALITATIVE: Hep B Surface Ab, Qual: REACTIVE

## 2020-06-17 ENCOUNTER — Other Ambulatory Visit: Payer: Self-pay

## 2020-06-17 ENCOUNTER — Ambulatory Visit
Admission: RE | Admit: 2020-06-17 | Discharge: 2020-06-17 | Disposition: A | Discharge status: Home / Self Care | Payer: Medicare HMO | Source: Ambulatory Visit | Attending: Gastroenterology | Admitting: Gastroenterology

## 2020-06-17 DIAGNOSIS — D696 Thrombocytopenia, unspecified: Secondary | ICD-10-CM | POA: Insufficient documentation

## 2020-06-20 ENCOUNTER — Encounter: Payer: Self-pay | Admitting: Gastroenterology

## 2020-08-15 ENCOUNTER — Telehealth (INDEPENDENT_AMBULATORY_CARE_PROVIDER_SITE_OTHER): Payer: Medicare HMO | Admitting: Gastroenterology

## 2020-08-15 DIAGNOSIS — Z5329 Procedure and treatment not carried out because of patient's decision for other reasons: Secondary | ICD-10-CM

## 2020-08-15 DIAGNOSIS — Z91199 Patient's noncompliance with other medical treatment and regimen due to unspecified reason: Secondary | ICD-10-CM

## 2020-08-15 NOTE — Progress Notes (Signed)
Called did not pick up the phone could not leave voicemail

## 2020-09-30 ENCOUNTER — Other Ambulatory Visit (INDEPENDENT_AMBULATORY_CARE_PROVIDER_SITE_OTHER): Payer: Self-pay | Admitting: Nurse Practitioner

## 2020-09-30 DIAGNOSIS — I70201 Unspecified atherosclerosis of native arteries of extremities, right leg: Secondary | ICD-10-CM

## 2020-09-30 DIAGNOSIS — I70221 Atherosclerosis of native arteries of extremities with rest pain, right leg: Secondary | ICD-10-CM

## 2020-10-01 ENCOUNTER — Encounter (INDEPENDENT_AMBULATORY_CARE_PROVIDER_SITE_OTHER): Payer: Medicare HMO

## 2020-10-01 ENCOUNTER — Ambulatory Visit (INDEPENDENT_AMBULATORY_CARE_PROVIDER_SITE_OTHER): Payer: Medicare HMO | Admitting: Vascular Surgery

## 2020-10-01 ENCOUNTER — Other Ambulatory Visit: Payer: Self-pay

## 2020-10-01 ENCOUNTER — Ambulatory Visit (INDEPENDENT_AMBULATORY_CARE_PROVIDER_SITE_OTHER): Payer: Medicare HMO

## 2020-10-01 ENCOUNTER — Encounter (INDEPENDENT_AMBULATORY_CARE_PROVIDER_SITE_OTHER): Payer: Self-pay | Admitting: Vascular Surgery

## 2020-10-01 VITALS — BP 122/79 | HR 66 | Resp 16 | Wt 146.0 lb

## 2020-10-01 DIAGNOSIS — I10 Essential (primary) hypertension: Secondary | ICD-10-CM

## 2020-10-01 DIAGNOSIS — E782 Mixed hyperlipidemia: Secondary | ICD-10-CM

## 2020-10-01 DIAGNOSIS — I70221 Atherosclerosis of native arteries of extremities with rest pain, right leg: Secondary | ICD-10-CM

## 2020-10-01 DIAGNOSIS — I70201 Unspecified atherosclerosis of native arteries of extremities, right leg: Secondary | ICD-10-CM

## 2020-10-01 NOTE — Progress Notes (Signed)
MRN : [id number]  Clayton Gill is a 69 y.o. (November 30, 1951) male who presents with chief complaint of  Chief Complaint  Patient presents with  . Follow-up    54month ultrasound follow up  .  History of Present Illness: Patient returns today in follow up of his peripheral arterial disease. He still has some pain in both legs with walking but no major changes from his last visit 6 months ago. No new ulceration or infection. No rest pain. ABIs today are stable at 0.77 on the right and 1.2 on the left.  Current Outpatient Medications  Medication Sig Dispense Refill  . amLODipine (NORVASC) 10 MG tablet Take 10 mg by mouth daily.    Marland Kitchen aspirin EC 81 MG tablet Take 1 tablet (81 mg total) by mouth daily. 150 tablet 2  . atorvastatin (LIPITOR) 40 MG tablet Take 40 mg by mouth daily.    . clopidogrel (PLAVIX) 75 MG tablet Take 75 mg by mouth daily.    Marland Kitchen dicyclomine (BENTYL) 10 MG capsule Take 1 capsule (10 mg total) by mouth 4 (four) times daily -  before meals and at bedtime. 120 capsule 5  . hydrochlorothiazide (HYDRODIURIL) 25 MG tablet Take 25 mg by mouth daily.    . hydrochlorothiazide (HYDRODIURIL) 25 MG tablet Take 25 mg by mouth daily.    Marland Kitchen lisinopril (PRINIVIL,ZESTRIL) 20 MG tablet Take 20 mg by mouth daily.    Marland Kitchen lisinopril (PRINIVIL,ZESTRIL) 40 MG tablet Take 40 mg by mouth daily.    Marland Kitchen omeprazole (PRILOSEC) 20 MG capsule Take 20 mg by mouth daily.    . pantoprazole (PROTONIX) 40 MG tablet Take 40 mg by mouth daily.    . simvastatin (ZOCOR) 20 MG tablet Take 20 mg by mouth daily.     No current facility-administered medications for this visit.    Past Medical History:  Diagnosis Date  . Chronic hepatitis C ([place])   . GERD (gastroesophageal reflux disease)   . High cholesterol   . Hypertension   . Rectal bleeding     Past Surgical History:  Procedure Laterality Date  . ABDOMINAL AORTIC ANEURYSM REPAIR    . CHOLECYSTECTOMY    . COLONOSCOPY    . COLONOSCOPY WITH PROPOFOL N/A  08/20/2016   Procedure: COLONOSCOPY WITH PROPOFOL;  Surgeon: Lollie Sails, MD;  Location: [facility] ENDOSCOPY;  Service: Endoscopy;  Laterality: N/A;  . COLONOSCOPY WITH PROPOFOL N/A 11/06/2019   Procedure: COLONOSCOPY WITH PROPOFOL;  Surgeon: Jonathon Bellows, MD;  Location: [facility] ENDOSCOPY;  Service: Gastroenterology;  Laterality: N/A;  . HEMORRHOIDECTOMY WITH HEMORRHOID BANDING    . HEMORROIDECTOMY    . LOWER EXTREMITY ANGIOGRAPHY Right 12/11/2019   Procedure: LOWER EXTREMITY ANGIOGRAPHY;  Surgeon: Algernon Huxley, MD;  Location: [place] CV LAB;  Service: Cardiovascular;  Laterality: Right;  . LOWER EXTREMITY ANGIOGRAPHY Right 12/21/2019   Procedure: LOWER EXTREMITY ANGIOGRAPHY;  Surgeon: Algernon Huxley, MD;  Location: [place] CV LAB;  Service: Cardiovascular;  Laterality: Right;  . surgery left leg       Social History   Tobacco Use  . Smoking status: Current Some Day Smoker    Packs/day: 0.75    Years: 40.00    Pack years: 30.00    Types: Cigarettes  . Smokeless tobacco: Never Used  Vaping Use  . Vaping Use: Never used  Substance Use Topics  . Alcohol use: No  . Drug use: No      Family History  Problem Relation Age of Onset  .  Hypertension Mother   . Diabetes Mother   no bleeding or clotting disorders  No Known Allergies  REVIEW OF SYSTEMS (Negative unless checked)  Constitutional: [] ?Weight loss  [] ?Fever  [] ?Chills Cardiac: [] ?Chest pain   [] ?Chest pressure   [] ?Palpitations   [] ?Shortness of breath when laying flat   [] ?Shortness of breath at rest   [] ?Shortness of breath with exertion. Vascular:  [x] ?Pain in legs with walking   [] ?Pain in legs at rest   [] ?Pain in legs when laying flat   [x] ?Claudication   [x] ?Pain in feet when walking  [] ?Pain in feet at rest  [] ?Pain in feet when laying flat   [] ?History of DVT   [] ?Phlebitis   [] ?Swelling in legs   [] ?Varicose veins   [] ?Non-healing ulcers Pulmonary:   [] ?Uses home oxygen   [] ?Productive cough   [] ?Hemoptysis    [] ?Wheeze  [] ?COPD   [] ?Asthma Neurologic:  [] ?Dizziness  [] ?Blackouts   [] ?Seizures   [] ?History of stroke   [] ?History of TIA  [] ?Aphasia   [] ?Temporary blindness   [] ?Dysphagia   [] ?Weakness or numbness in arms   [] ?Weakness or numbness in legs Musculoskeletal:  [x] ?Arthritis   [] ?Joint swelling   [] ?Joint pain   [] ?Low back pain Hematologic:  [] ?Easy bruising  [] ?Easy bleeding   [] ?Hypercoagulable state   [] ?Anemic  [] ?Hepatitis Gastrointestinal:  [] ?Blood in stool   [] ?Vomiting blood  [x] ?Gastroesophageal reflux/heartburn   [] ?Abdominal pain Genitourinary:  [] ?Chronic kidney disease   [] ?Difficult urination  [] ?Frequent urination  [] ?Burning with urination   [] ?Hematuria Skin:  [] ?Rashes   [] ?Ulcers   [] ?Wounds Psychological:  [] ?History of anxiety   [] ? History of major depression.  Physical Examination  BP 122/79 (BP Location: Right Arm)   Pulse 66   Resp 16   Wt 146 lb (66.2 kg)   BMI 19.26 kg/m  Gen:  WD/WN, NAD Head: [place]/AT, No temporalis wasting. Ear/Nose/Throat: Hearing grossly intact, nares w/o erythema or drainage Eyes: Conjunctiva clear. Sclera non-icteric Neck: Supple.  Trachea midline Pulmonary:  Good air movement, no use of accessory muscles.  Cardiac: RRR, no JVD Vascular:  Vessel Right Left  Radial Palpable Palpable                          PT  2+ palpable  2+ palpable  DP  1+ palpable  2+ palpable   Gastrointestinal: soft, non-tender/non-distended. No guarding/reflex.  Musculoskeletal: M/S 5/5 throughout.  No deformity or atrophy. No edema. Neurologic: Sensation grossly intact in extremities.  Symmetrical.  Speech is fluent.  Psychiatric: Judgment intact, Mood & affect appropriate for pt's clinical situation. Dermatologic: No rashes or ulcers noted.  No cellulitis or open wounds.       Labs No results found for this or any previous visit (from the past 2160 hour(s)).  Radiology No results found.  Assessment/Plan  Atherosclerosis of native  arteries of extremity with rest pain (HCC) ABIs today are stable at 0.77 on the right and 1.2 on the left. Better after intervention earlier this year. Still some disease on the right. Continue 46-month follow-up. Recommend lifestyle modification including smoking cessation. Continue current medical regimen.  Essential (primary) hypertension blood pressure control important in reducing the progression of atherosclerotic disease. On appropriate oral medications.   Mixed hyperlipidemia lipid control important in reducing the progression of atherosclerotic disease. Continue statin therapy     Leotis Pain, MD  10/01/2020 2:09 PM    This note was created with Dragon medical transcription system.  Any  errors from dictation are purely unintentional

## 2020-10-01 NOTE — Assessment & Plan Note (Signed)
ABIs today are stable at 0.77 on the right and 1.2 on the left. Better after intervention earlier this year. Still some disease on the right. Continue 62-month follow-up. Recommend lifestyle modification including smoking cessation. Continue current medical regimen.

## 2020-10-01 NOTE — Assessment & Plan Note (Signed)
blood pressure control important in reducing the progression of atherosclerotic disease. On appropriate oral medications.  

## 2020-10-01 NOTE — Assessment & Plan Note (Signed)
lipid control important in reducing the progression of atherosclerotic disease. Continue statin therapy  

## 2020-10-16 ENCOUNTER — Telehealth: Payer: Self-pay | Admitting: *Deleted

## 2020-10-16 DIAGNOSIS — Z122 Encounter for screening for malignant neoplasm of respiratory organs: Secondary | ICD-10-CM

## 2020-10-16 DIAGNOSIS — Z87891 Personal history of nicotine dependence: Secondary | ICD-10-CM

## 2020-10-16 NOTE — Telephone Encounter (Signed)
Attempted to contact and schedule lung screening scan. Message left for patient to call back to schedule. 

## 2020-10-16 NOTE — Addendum Note (Signed)
Addended by: Lieutenant Diego on: 10/16/2020 01:05 PM   Modules accepted: Orders

## 2020-10-16 NOTE — Telephone Encounter (Signed)
Contacted and scheduled for lung screening scan. Current smoker, 30.5 pack year

## 2020-10-22 ENCOUNTER — Ambulatory Visit: Payer: Medicare HMO

## 2020-10-25 ENCOUNTER — Ambulatory Visit: Payer: Medicare HMO

## 2020-10-28 ENCOUNTER — Other Ambulatory Visit: Payer: Self-pay

## 2020-10-28 ENCOUNTER — Ambulatory Visit
Admission: RE | Admit: 2020-10-28 | Discharge: 2020-10-28 | Disposition: A | Discharge status: Home / Self Care | Payer: Medicare HMO | Source: Ambulatory Visit | Attending: Nurse Practitioner | Admitting: Nurse Practitioner

## 2020-10-28 DIAGNOSIS — Z122 Encounter for screening for malignant neoplasm of respiratory organs: Secondary | ICD-10-CM

## 2020-10-28 DIAGNOSIS — Z87891 Personal history of nicotine dependence: Secondary | ICD-10-CM

## 2020-11-06 ENCOUNTER — Telehealth: Payer: Self-pay | Admitting: *Deleted

## 2020-11-06 NOTE — Telephone Encounter (Signed)
Notified patient of LDCT lung cancer screening program results with recommendation for 6 month follow up imaging. Also notified of incidental findings noted below and is encouraged to discuss further with PCP who will receive a copy of this note and/or the CT report. Patient verbalizes understanding.   IMPRESSION: 1. Lung-RADS 3, probably benign findings. Short-term follow-up in 6 months is recommended with repeat low-dose chest CT without contrast (please use the following order, "CT CHEST LCS NODULE FOLLOW-UP W/O CM"). 2. Coronary artery calcifications noted.  Aortic Atherosclerosis (ICD10-I70.0) and Emphysema (ICD10-J43.9).

## 2020-12-16 ENCOUNTER — Other Ambulatory Visit: Payer: Self-pay | Admitting: Family Medicine

## 2020-12-16 ENCOUNTER — Other Ambulatory Visit (HOSPITAL_COMMUNITY): Payer: Self-pay | Admitting: Family Medicine

## 2020-12-16 DIAGNOSIS — R1013 Epigastric pain: Secondary | ICD-10-CM

## 2020-12-25 ENCOUNTER — Ambulatory Visit: Admission: RE | Admit: 2020-12-25 | Payer: Medicare HMO | Source: Ambulatory Visit

## 2021-03-06 ENCOUNTER — Emergency Department: Payer: Medicare HMO

## 2021-03-06 ENCOUNTER — Inpatient Hospital Stay
Admission: EM | Admit: 2021-03-06 | Discharge: 2021-03-13 | DRG: 441 | Disposition: A | Discharge status: Hospice | Payer: Medicare HMO | Attending: Internal Medicine | Admitting: Internal Medicine

## 2021-03-06 ENCOUNTER — Other Ambulatory Visit: Payer: Self-pay

## 2021-03-06 DIAGNOSIS — Z8679 Personal history of other diseases of the circulatory system: Secondary | ICD-10-CM

## 2021-03-06 DIAGNOSIS — W19XXXA Unspecified fall, initial encounter: Secondary | ICD-10-CM | POA: Diagnosis present

## 2021-03-06 DIAGNOSIS — G928 Other toxic encephalopathy: Secondary | ICD-10-CM | POA: Diagnosis present

## 2021-03-06 DIAGNOSIS — K7290 Hepatic failure, unspecified without coma: Principal | ICD-10-CM

## 2021-03-06 DIAGNOSIS — Z66 Do not resuscitate: Secondary | ICD-10-CM | POA: Diagnosis not present

## 2021-03-06 DIAGNOSIS — C787 Secondary malignant neoplasm of liver and intrahepatic bile duct: Secondary | ICD-10-CM | POA: Diagnosis present

## 2021-03-06 DIAGNOSIS — K7200 Acute and subacute hepatic failure without coma: Secondary | ICD-10-CM | POA: Diagnosis present

## 2021-03-06 DIAGNOSIS — Z515 Encounter for palliative care: Secondary | ICD-10-CM | POA: Diagnosis not present

## 2021-03-06 DIAGNOSIS — Z8249 Family history of ischemic heart disease and other diseases of the circulatory system: Secondary | ICD-10-CM | POA: Diagnosis not present

## 2021-03-06 DIAGNOSIS — R180 Malignant ascites: Secondary | ICD-10-CM | POA: Diagnosis present

## 2021-03-06 DIAGNOSIS — I2489 Other forms of acute ischemic heart disease: Secondary | ICD-10-CM

## 2021-03-06 DIAGNOSIS — T451X5A Adverse effect of antineoplastic and immunosuppressive drugs, initial encounter: Secondary | ICD-10-CM | POA: Diagnosis present

## 2021-03-06 DIAGNOSIS — K219 Gastro-esophageal reflux disease without esophagitis: Secondary | ICD-10-CM | POA: Diagnosis present

## 2021-03-06 DIAGNOSIS — Z7982 Long term (current) use of aspirin: Secondary | ICD-10-CM

## 2021-03-06 DIAGNOSIS — I714 Abdominal aortic aneurysm, without rupture: Secondary | ICD-10-CM | POA: Diagnosis present

## 2021-03-06 DIAGNOSIS — Z681 Body mass index (BMI) 19 or less, adult: Secondary | ICD-10-CM

## 2021-03-06 DIAGNOSIS — D6959 Other secondary thrombocytopenia: Secondary | ICD-10-CM | POA: Diagnosis present

## 2021-03-06 DIAGNOSIS — R7401 Elevation of levels of liver transaminase levels: Secondary | ICD-10-CM | POA: Diagnosis present

## 2021-03-06 DIAGNOSIS — Z79899 Other long term (current) drug therapy: Secondary | ICD-10-CM

## 2021-03-06 DIAGNOSIS — N179 Acute kidney failure, unspecified: Secondary | ICD-10-CM | POA: Diagnosis present

## 2021-03-06 DIAGNOSIS — I248 Other forms of acute ischemic heart disease: Secondary | ICD-10-CM | POA: Diagnosis present

## 2021-03-06 DIAGNOSIS — B182 Chronic viral hepatitis C: Secondary | ICD-10-CM | POA: Diagnosis present

## 2021-03-06 DIAGNOSIS — Z20822 Contact with and (suspected) exposure to covid-19: Secondary | ICD-10-CM | POA: Diagnosis present

## 2021-03-06 DIAGNOSIS — G9341 Metabolic encephalopathy: Secondary | ICD-10-CM | POA: Diagnosis not present

## 2021-03-06 DIAGNOSIS — E43 Unspecified severe protein-calorie malnutrition: Secondary | ICD-10-CM | POA: Diagnosis not present

## 2021-03-06 DIAGNOSIS — R778 Other specified abnormalities of plasma proteins: Secondary | ICD-10-CM | POA: Diagnosis not present

## 2021-03-06 DIAGNOSIS — E785 Hyperlipidemia, unspecified: Secondary | ICD-10-CM | POA: Diagnosis present

## 2021-03-06 DIAGNOSIS — T3995XA Adverse effect of unspecified nonopioid analgesic, antipyretic and antirheumatic, initial encounter: Secondary | ICD-10-CM | POA: Diagnosis present

## 2021-03-06 DIAGNOSIS — E7800 Pure hypercholesterolemia, unspecified: Secondary | ICD-10-CM | POA: Diagnosis present

## 2021-03-06 DIAGNOSIS — I10 Essential (primary) hypertension: Secondary | ICD-10-CM | POA: Diagnosis present

## 2021-03-06 DIAGNOSIS — R7989 Other specified abnormal findings of blood chemistry: Secondary | ICD-10-CM | POA: Diagnosis present

## 2021-03-06 DIAGNOSIS — K7682 Hepatic encephalopathy: Secondary | ICD-10-CM

## 2021-03-06 DIAGNOSIS — Z7902 Long term (current) use of antithrombotics/antiplatelets: Secondary | ICD-10-CM

## 2021-03-06 DIAGNOSIS — R140 Abdominal distension (gaseous): Secondary | ICD-10-CM

## 2021-03-06 DIAGNOSIS — C259 Malignant neoplasm of pancreas, unspecified: Secondary | ICD-10-CM

## 2021-03-06 DIAGNOSIS — R188 Other ascites: Secondary | ICD-10-CM

## 2021-03-06 DIAGNOSIS — F17210 Nicotine dependence, cigarettes, uncomplicated: Secondary | ICD-10-CM | POA: Diagnosis present

## 2021-03-06 LAB — COMPREHENSIVE METABOLIC PANEL
ALT: 97 U/L — ABNORMAL HIGH (ref 0–44)
AST: 59 U/L — ABNORMAL HIGH (ref 15–41)
Albumin: 2.8 g/dL — ABNORMAL LOW (ref 3.5–5.0)
Alkaline Phosphatase: 513 U/L — ABNORMAL HIGH (ref 38–126)
Anion gap: 8 (ref 5–15)
BUN: 41 mg/dL — ABNORMAL HIGH (ref 8–23)
CO2: 25 mmol/L (ref 22–32)
Calcium: 8.7 mg/dL — ABNORMAL LOW (ref 8.9–10.3)
Chloride: 107 mmol/L (ref 98–111)
Creatinine, Ser: 1.42 mg/dL — ABNORMAL HIGH (ref 0.61–1.24)
GFR, Estimated: 54 mL/min — ABNORMAL LOW (ref >60)
Glucose, Bld: 72 mg/dL (ref 70–99)
Potassium: 4.2 mmol/L (ref 3.5–5.1)
Sodium: 140 mmol/L (ref 135–145)
Total Bilirubin: 2.9 mg/dL — ABNORMAL HIGH (ref 0.3–1.2)
Total Protein: 6.8 g/dL (ref 6.5–8.1)

## 2021-03-06 LAB — CBC WITH DIFFERENTIAL/PLATELET
Abs Immature Granulocytes: 0.02 10*3/uL (ref 0.00–0.07)
Basophils Absolute: 0 10*3/uL (ref 0.0–0.1)
Basophils Relative: 0 %
Eosinophils Absolute: 0 10*3/uL (ref 0.0–0.5)
Eosinophils Relative: 1 %
HCT: 32.3 % — ABNORMAL LOW (ref 39.0–52.0)
Hemoglobin: 10.9 g/dL — ABNORMAL LOW (ref 13.0–17.0)
Immature Granulocytes: 0 %
Lymphocytes Relative: 20 %
Lymphs Abs: 0.9 10*3/uL (ref 0.7–4.0)
MCH: 31.2 pg (ref 26.0–34.0)
MCHC: 33.7 g/dL (ref 30.0–36.0)
MCV: 92.6 fL (ref 80.0–100.0)
Monocytes Absolute: 0.4 10*3/uL (ref 0.1–1.0)
Monocytes Relative: 8 %
Neutro Abs: 3.3 10*3/uL (ref 1.7–7.7)
Neutrophils Relative %: 71 %
Platelets: 100 10*3/uL — ABNORMAL LOW (ref 150–400)
RBC: 3.49 MIL/uL — ABNORMAL LOW (ref 4.22–5.81)
RDW: 16 % — ABNORMAL HIGH (ref 11.5–15.5)
WBC: 4.6 10*3/uL (ref 4.0–10.5)
nRBC: 0 % (ref 0.0–0.2)

## 2021-03-06 LAB — BLOOD GAS, VENOUS
Acid-Base Excess: 1 mmol/L (ref 0.0–2.0)
Bicarbonate: 26 mmol/L (ref 20.0–28.0)
O2 Saturation: 90.2 %
Patient temperature: 37
pCO2, Ven: 42 mmHg — ABNORMAL LOW (ref 44.0–60.0)
pH, Ven: 7.4 (ref 7.250–7.430)
pO2, Ven: 59 mmHg — ABNORMAL HIGH (ref 32.0–45.0)

## 2021-03-06 LAB — RESP PANEL BY RT-PCR (FLU A&B, COVID) ARPGX2
Influenza A by PCR: NEGATIVE
Influenza B by PCR: NEGATIVE
SARS Coronavirus 2 by RT PCR: NEGATIVE

## 2021-03-06 LAB — ETHANOL: Alcohol, Ethyl (B): <10 mg/dL (ref <10)

## 2021-03-06 LAB — TROPONIN I (HIGH SENSITIVITY)
Troponin I (High Sensitivity): 62 ng/L — ABNORMAL HIGH (ref <18)
Troponin I (High Sensitivity): 60 ng/L — ABNORMAL HIGH (ref <18)

## 2021-03-06 LAB — BRAIN NATRIURETIC PEPTIDE: B Natriuretic Peptide: 205.9 pg/mL — ABNORMAL HIGH (ref 0.0–100.0)

## 2021-03-06 LAB — PROTIME-INR
INR: 1.4 — ABNORMAL HIGH (ref 0.8–1.2)
Prothrombin Time: 17 seconds — ABNORMAL HIGH (ref 11.4–15.2)

## 2021-03-06 LAB — AMMONIA: Ammonia: 112 umol/L — ABNORMAL HIGH (ref 9–35)

## 2021-03-06 LAB — CK: Total CK: 129 U/L (ref 49–397)

## 2021-03-06 LAB — LIPASE, BLOOD: Lipase: 25 U/L (ref 11–51)

## 2021-03-06 LAB — MAGNESIUM: Magnesium: 1.6 mg/dL — ABNORMAL LOW (ref 1.7–2.4)

## 2021-03-06 LAB — APTT: aPTT: 33 seconds (ref 24–36)

## 2021-03-06 LAB — GLUCOSE, CAPILLARY: Glucose-Capillary: 133 mg/dL — ABNORMAL HIGH (ref 70–99)

## 2021-03-06 MED ORDER — NALOXONE HCL 0.4 MG/ML IJ SOLN
0.4000 mg | INTRAMUSCULAR | Status: DC | PRN
  Administered 2021-03-06: 0.4 mg via INTRAVENOUS
  Filled 2021-03-06: qty 1

## 2021-03-06 MED ORDER — LACTATED RINGERS IV SOLN: INTRAVENOUS | Status: AC

## 2021-03-06 MED ORDER — MAGNESIUM SULFATE 2 GM/50ML IV SOLN
2.0000 g | Freq: Once | INTRAVENOUS | Status: AC
  Administered 2021-03-06: 2 g via INTRAVENOUS
  Filled 2021-03-06: qty 50

## 2021-03-06 MED ORDER — ACETAMINOPHEN 325 MG PO TABS
650.0000 mg | ORAL_TABLET | Freq: Four times a day (QID) | ORAL | Status: DC | PRN
  Administered 2021-03-08: 650 mg via ORAL
  Filled 2021-03-06: qty 2

## 2021-03-06 MED ORDER — LACTULOSE 10 GM/15ML PO SOLN: 20.0000 g | Freq: Once | ORAL | Status: DC

## 2021-03-06 MED ORDER — IOHEXOL 350 MG/ML SOLN
75.0000 mL | Freq: Once | INTRAVENOUS | Status: AC | PRN
  Administered 2021-03-06: 75 mL via INTRAVENOUS

## 2021-03-06 MED ORDER — ACETAMINOPHEN 650 MG RE SUPP: 650.0000 mg | Freq: Four times a day (QID) | RECTAL | Status: DC | PRN

## 2021-03-06 MED ORDER — NICOTINE 14 MG/24HR TD PT24: 14.0000 mg | MEDICATED_PATCH | Freq: Every day | TRANSDERMAL | Status: DC | PRN

## 2021-03-06 NOTE — ED Notes (Signed)
Informed CT of power port which is now documented in LDAs.

## 2021-03-06 NOTE — ED Notes (Signed)
2 IVs placed. Blood is very thick and will not draw from IVs. Will find second nurse to help with accessing implanted port.

## 2021-03-06 NOTE — ED Notes (Signed)
IV does not draw blood. Attempting IV placement for blood draw.

## 2021-03-06 NOTE — ED Notes (Signed)
Patient currently refusing lactulose. States he wants "real food" Will try again later.

## 2021-03-06 NOTE — ED Provider Notes (Signed)
[facility] Emergency Department Provider Note  ____________________________________________   Event Date/Time   First MD Initiated Contact with Patient 03/06/21 0845     (approximate)  I have reviewed the triage vital signs and the nursing notes.   HISTORY  Chief Complaint Fall and Weakness    HPI Clayton Gill is a 70 y.o. male with hepatitis C, pancreatic cancer who comes in with concerns for weakness.  Patient is neighbor was going to take patient to chemotherapy this morning.  However when they knocked the door he did not respond.  It took him 10 minutes to get the house and when they did they thought that he was more lethargic than normal.  They did report that he had a fall yesterday where they found him on the ground and helped him up.  Patient is on Plavix.  Patient is not sure if he lost consciousness and is not really sure of why he fell.  Unable to get full HPI due to patient's mental status.          Past Medical History:  Diagnosis Date  . Chronic hepatitis C ([place])   . GERD (gastroesophageal reflux disease)   . High cholesterol   . Hypertension   . Rectal bleeding     Patient Active Problem List   Diagnosis Date Noted  . Neuropathic pain of left lower extremity 11/10/2019  . Chronic viral hepatitis C ([place]) 03/16/2016  . Mixed hyperlipidemia 03/16/2016  . Mononeuropathy of left lower extremity 03/16/2016  . Encounter for screening for malignant neoplasm of colon 01/27/2016  . Other injury of unspecified body region 06/03/2015  . Abdominal aortic aneurysm without rupture ([place]) 05/14/2015  . Atherosclerosis of native arteries of extremity with rest pain ([place]) 05/14/2015  . Abnormal results of liver function studies 01/24/2015  . Essential (primary) hypertension 01/22/2015    Past Surgical History:  Procedure Laterality Date  . ABDOMINAL AORTIC ANEURYSM REPAIR    . CHOLECYSTECTOMY    . COLONOSCOPY    . COLONOSCOPY WITH  PROPOFOL N/A 08/20/2016   Procedure: COLONOSCOPY WITH PROPOFOL;  Surgeon: Lollie Sails, MD;  Location: [facility] ENDOSCOPY;  Service: Endoscopy;  Laterality: N/A;  . COLONOSCOPY WITH PROPOFOL N/A 11/06/2019   Procedure: COLONOSCOPY WITH PROPOFOL;  Surgeon: Jonathon Bellows, MD;  Location: [facility] ENDOSCOPY;  Service: Gastroenterology;  Laterality: N/A;  . HEMORRHOIDECTOMY WITH HEMORRHOID BANDING    . HEMORROIDECTOMY    . LOWER EXTREMITY ANGIOGRAPHY Right 12/11/2019   Procedure: LOWER EXTREMITY ANGIOGRAPHY;  Surgeon: Algernon Huxley, MD;  Location: [place] CV LAB;  Service: Cardiovascular;  Laterality: Right;  . LOWER EXTREMITY ANGIOGRAPHY Right 12/21/2019   Procedure: LOWER EXTREMITY ANGIOGRAPHY;  Surgeon: Algernon Huxley, MD;  Location: [place] CV LAB;  Service: Cardiovascular;  Laterality: Right;  . surgery left leg      Prior to Admission medications   Medication Sig Start Date End Date Taking? Authorizing Provider  amLODipine (NORVASC) 10 MG tablet Take 10 mg by mouth daily. 09/25/19   [provider]  aspirin EC 81 MG tablet Take 1 tablet (81 mg total) by mouth daily. 12/21/19   Algernon Huxley, MD  atorvastatin (LIPITOR) 40 MG tablet Take 40 mg by mouth daily.    [provider]  clopidogrel (PLAVIX) 75 MG tablet Take 75 mg by mouth daily.    [provider]  dicyclomine (BENTYL) 10 MG capsule Take 1 capsule (10 mg total) by mouth 4 (four) times daily -  before meals and at bedtime. 03/05/20   Jonathon Bellows, MD  hydrochlorothiazide (HYDRODIURIL) 25 MG tablet Take 25 mg by mouth daily.    [provider]  hydrochlorothiazide (HYDRODIURIL) 25 MG tablet Take 25 mg by mouth daily.    [provider]  lisinopril (PRINIVIL,ZESTRIL) 20 MG tablet Take 20 mg by mouth daily.    [provider]  lisinopril (PRINIVIL,ZESTRIL) 40 MG tablet Take 40 mg by mouth daily.    [provider]  omeprazole (PRILOSEC) 20 MG capsule Take 20 mg by mouth daily.  09/25/19   [provider]  pantoprazole (PROTONIX) 40 MG tablet Take 40 mg by mouth daily.    [provider]  simvastatin (ZOCOR) 20 MG tablet Take 20 mg by mouth daily.    [provider]    Allergies Patient has no known allergies.  Family History  Problem Relation Age of Onset  . Hypertension Mother   . Diabetes Mother     Social History Social History   Tobacco Use  . Smoking status: Current Some Day Smoker    Packs/day: 0.75    Years: 40.00    Pack years: 30.00    Types: Cigarettes  . Smokeless tobacco: Never Used  Vaping Use  . Vaping Use: Never used  Substance Use Topics  . Alcohol use: No  . Drug use: No      Review of Systems Unable to get full review of systems due to patient's mental status ____________________________________________   PHYSICAL EXAM:  VITAL SIGNS: Blood pressure (!) 125/92, pulse 70, temperature 98.2 F (36.8 C), temperature source Oral, resp. rate (!) 9, height 6\' 1"  (1.854 m), weight 59 kg, SpO2 100 %.   Constitutional: Alert and oriented.  Frail cachectic male Eyes: Conjunctivae are normal. EOMI. Head: Atraumatic. Nose: No congestion/rhinnorhea. Mouth/Throat: Mucous membranes are moist.   Neck: No stridor. Trachea Midline. FROM Cardiovascular: Normal rate, regular rhythm. Grossly normal heart sounds.  Good peripheral circulation.  Right chest wall port Respiratory: Normal respiratory effort.  No retractions. Lungs CTAB. Gastrointestinal: Soft and nontender.  Abdomen distended with slight tenderness Musculoskeletal: No lower extremity tenderness nor edema.  No joint effusions.  No tenderness on his extremities. Neurologic:  Normal speech and language. No gross focal neurologic deficits are appreciated.  Equal strength in arms and legs Skin:  Skin is warm, dry and intact. No rash noted. Psychiatric: Mood and affect are normal. Speech and behavior are normal. GU: Deferred    ____________________________________________   LABS (all labs ordered are listed, but only abnormal results are displayed)  Labs Reviewed  CBC WITH DIFFERENTIAL/PLATELET - Abnormal; Notable for the following components:      Result Value   RBC 3.49 (*)    Hemoglobin 10.9 (*)    HCT 32.3 (*)    RDW 16.0 (*)    Platelets 100 (*)    All other components within normal limits  COMPREHENSIVE METABOLIC PANEL - Abnormal; Notable for the following components:   BUN 41 (*)    Creatinine, Ser 1.42 (*)    Calcium 8.7 (*)    Albumin 2.8 (*)    AST 59 (*)    ALT 97 (*)    Alkaline Phosphatase 513 (*)    Total Bilirubin 2.9 (*)    GFR, Estimated 54 (*)    All other components within normal limits  AMMONIA - Abnormal; Notable for the following components:   Ammonia 112 (*)    All other components within normal limits  MAGNESIUM - Abnormal; Notable for the following components:   Magnesium 1.6 (*)    All other components within normal limits  PROTIME-INR - Abnormal; Notable for the following components:   Prothrombin Time 17.0 (*)    INR 1.4 (*)    All other components within normal limits  BRAIN NATRIURETIC PEPTIDE - Abnormal; Notable for the following components:   B Natriuretic Peptide 205.9 (*)    All other components within normal limits  TROPONIN I (HIGH SENSITIVITY) - Abnormal; Notable for the following components:   Troponin I (High Sensitivity) 62 (*)    All other components within normal limits  TROPONIN I (HIGH SENSITIVITY) - Abnormal; Notable for the following components:   Troponin I (High Sensitivity) 60 (*)    All other components within normal limits  RESP PANEL BY RT-PCR (FLU A&B, COVID) ARPGX2  LIPASE, BLOOD  ETHANOL  CK  APTT  URINALYSIS, COMPLETE (UACMP) WITH MICROSCOPIC  URINE DRUG SCREEN, QUALITATIVE ([facility] ONLY)   ____________________________________________   ED ECG REPORT I, Vanessa [place], the attending physician, personally viewed and interpreted  this ECG.  Normal sinus rate 68, no ST elevation, no T wave inversions, normal intervals ____________________________________________  RADIOLOGY Robert Bellow, personally viewed and evaluated these images (plain radiographs) as part of my medical decision making, as well as reviewing the written report by the radiologist.  ED MD interpretation: No pneumonia  Official radiology report(s): CT Head Wo Contrast  Result Date: 03/06/2021 CLINICAL DATA:  Found on floor, fall, pancreatic cancer EXAM: CT HEAD WITHOUT CONTRAST CT CERVICAL SPINE WITHOUT CONTRAST TECHNIQUE: Multidetector CT imaging of the head and cervical spine was performed following the standard protocol without intravenous contrast. Multiplanar CT image reconstructions of the cervical spine were also generated. COMPARISON:  None. FINDINGS: CT HEAD FINDINGS Brain: No evidence of acute infarction, hemorrhage, hydrocephalus, extra-axial collection or mass lesion/mass effect. Periventricular and deep white matter hypodensity 3 Vascular: No hyperdense vessel or unexpected calcification. Skull: Normal. Negative for fracture or focal lesion. Sinuses/Orbits: No acute finding. Other: None. CT CERVICAL SPINE FINDINGS Alignment: Normal. Skull base and vertebrae: No acute fracture. No primary bone lesion or focal pathologic process. Soft tissues and spinal canal: No prevertebral fluid or swelling. No visible canal hematoma. Disc levels: Intact. Moderate multilevel bilateral facet degenerative change. Upper chest: Negative. Other: None. IMPRESSION: 1. No acute intracranial pathology. Small-vessel white matter disease. 2. No fracture or static subluxation of the cervical spine. Moderate multilevel bilateral facet degenerative change. Electronically Signed   By: Eddie Candle M.D.   On: 03/06/2021 12:40   CT Cervical Spine Wo Contrast  Result Date: 03/06/2021 CLINICAL DATA:  Found on floor, fall, pancreatic cancer EXAM: CT HEAD WITHOUT CONTRAST CT  CERVICAL SPINE WITHOUT CONTRAST TECHNIQUE: Multidetector CT imaging of the head and cervical spine was performed following the standard protocol without intravenous contrast. Multiplanar CT image reconstructions of the cervical spine were also generated. COMPARISON:  None. FINDINGS: CT HEAD FINDINGS Brain: No evidence of acute infarction, hemorrhage, hydrocephalus, extra-axial collection or mass lesion/mass effect. Periventricular and deep white matter hypodensity 3 Vascular: No hyperdense vessel or unexpected calcification. Skull: Normal. Negative for fracture or focal lesion. Sinuses/Orbits: No acute finding. Other: None. CT CERVICAL SPINE FINDINGS Alignment: Normal. Skull base and vertebrae: No acute fracture. No primary bone lesion or focal pathologic process. Soft tissues and spinal canal: No prevertebral fluid or swelling. No visible canal hematoma. Disc levels: Intact. Moderate multilevel bilateral facet degenerative change. Upper chest: Negative. Other: None.  IMPRESSION: 1. No acute intracranial pathology. Small-vessel white matter disease. 2. No fracture or static subluxation of the cervical spine. Moderate multilevel bilateral facet degenerative change. Electronically Signed   By: Eddie Candle M.D.   On: 03/06/2021 12:40   DG Chest Portable 1 View  Result Date: 03/06/2021 CLINICAL DATA:  70 year old male with pancreatic cancer, found down. Lethargy. EXAM: PORTABLE CHEST 1 VIEW COMPARISON:  CT chest 10/28/2020. FINDINGS: Portable AP upright view at 0853 hours. New right chest Port-A-Cath. Normal cardiac size and mediastinal contours. Visualized tracheal air column is within normal limits. Allowing for portable technique the lungs are clear. No pneumothorax. No acute osseous abnormality identified. Stable cholecystectomy clips. Paucity of bowel gas in the upper abdomen. IMPRESSION: Negative portable chest with right side Port-A-Cath in place. Electronically Signed   By: Genevie Ann M.D.   On: 03/06/2021  09:07    ____________________________________________   PROCEDURES  Procedure(s) performed (including Critical Care):  .1-3 Lead EKG Interpretation Performed by: Vanessa [place], MD Authorized by: Vanessa [place], MD     Interpretation: normal     ECG rate:  70s   ECG rate assessment: normal     Rhythm: sinus rhythm     Ectopy: none     Conduction: normal       ____________________________________________   INITIAL IMPRESSION / ASSESSMENT AND PLAN / ED COURSE  Marcy Dewey was evaluated in Emergency Department on 03/06/2021 for the symptoms described in the history of present illness. He was evaluated in the context of the global COVID-19 pandemic, which necessitated consideration that the patient might be at risk for infection with the SARS-CoV-2 virus that causes COVID-19. Institutional protocols and algorithms that pertain to the evaluation of patients at risk for COVID-19 are in a state of rapid change based on information released by regulatory bodies including the CDC and federal and state organizations. These policies and algorithms were followed during the patient's care in the ED.    Patient is a 70 year old with known pancreatic cancer who comes in with increasing weakness and distention of his abdomen with concern for a fall yesterday.  Although patient is alert and oriented he does not participate much in HPI.  He states that he just feels weak and does not only remember what happened.  Labs ordered evaluate for anemia, electrolyte abnormalities, hepatic dysfunction, ACS, Covid.  CT imaging ordered to evaluate for intracranial hemorrhage, cervical fracture and to evaluate his aneurysm.   Cardiac marker is elevated but stable/suspect this is more demand LFTs are now elevated with a total bili of 2.9 up from 10 months ago.  His ammonia level is elevated so I suspect that he has some component of hepatic encephalopathy causing his weakness.  We will give a dose of  lactulose.  Patient CT scan shows new ascites.   We will discuss with the hospital team for admission for new hepatic failure complicated by hepatic encephalopathy.  We will give a dose of lactulose.     ____________________________________________   FINAL CLINICAL IMPRESSION(S) / ED DIAGNOSES   Final diagnoses:  Hepatic encephalopathy ([place])  Malignant neoplasm of pancreas, unspecified location of malignancy ([place])  Acute liver failure without hepatic coma      MEDICATIONS GIVEN DURING THIS VISIT:  Medications  lactulose (CHRONULAC) 10 GM/15ML solution 20 g (has no administration in time range)  iohexol (OMNIPAQUE) 350 MG/ML injection 75 mL (75 mLs Intravenous Contrast Given 03/06/21 1210)     ED Discharge Orders  None       Note:  This document was prepared using Dragon voice recognition software and may include unintentional dictation errors.   Vanessa [place], MD 03/06/21 1323

## 2021-03-06 NOTE — H&P (Addendum)
History and Physical    PLEASE NOTE THAT DRAGON DICTATION SOFTWARE WAS USED IN THE CONSTRUCTION OF THIS NOTE.   [name] SWF:[id number] DOB: [birth year] DOA: 03/06/2021  PCP: Theotis Burrow, MD Patient coming from: home   I have personally briefly reviewed patient's old medical records in [place]  Chief Complaint: Altered mental status  HPI: Clayton Gill is a 70 y.o. male with medical history significant for recently diagnosed pancreatic cancer on chemotherapy, biliary obstruction status post placement of biliary stent, hypertension, hyperlipidemia, chronic hepatitis C, AAA status post repair, who is admitted to [facility] on 03/06/2021 with acute metabolic encephalopathy after presenting from home to [facility] ED for evaluation of altered mental status.   In the context of the patient's altered mental status, his provision of history is limited at this time.  Additional history is obtained via discussions with the emergency department physician and via chart review.  The patient reports that he was diagnosed with pancreatic cancer approximately 3 weeks ago, with interval initiation of chemotherapy x 1 session, which he reports occurred 1 week ago.  The patient was reportedly scheduled for his next chemo therapy session today.  When the patient's neighbor, who was responsible for taking the patient to this chemotherapy session, arrived at the patient's home, he found the patient to be laying in bed and noted him to be somnolent, lethargic, and mildly confused relative to baseline, prompting the patient to be brought to [facility] ED for further evaluation of such.  The patient's friend also noted that the patient had fallen yesterday, although the details of this fall are unclear as it was unwitnessed by the patient's friend.  The patient does not recall the details relating to this fall, and is unsure if he lost consciousness or if he hit his head.  The  patient reports recent development of mild abdominal swelling in the absence of any associated abdominal discomfort.  He notes new onset dysuria over the last few days, in the absence of any associated gross hematuria or change in urinary urgency/frequency.  Denies any chest pain, shortness of breath, palpitations, paresthesias.  Denies any recent nausea/vomiting, but reports recent decline in oral intake in the setting of decline in appetite.  Denies any recent diarrhea.   Denies any recent subjective fever, chills, rigors, or generalized myalgias.  No recent headache, neck stiffness, cough.   Per chart review, it appears that the patient has a history of biliary obstruction, status post placement of metallic biliary stent.  Most recent prior liver enzymes, per chart review, were drawn on 02/19/2021 and were notable for the following: Albumin 2.7, alkaline phosphatase 769, AST 320, ALT 424, total bilirubin 4.7.  Per chart review, patient has a history of chronic hepatitis C, with most recent HCVRNA PCR showing a nondetectable viral load on 12/18/2020.  The patient reports that he he was treated with antiviral therapy as an outpatient, and conveys that he completed this therapy approximately 6 months ago.  Per chart review, it appears that the patient has previously followed with [place], specifically with Dr. Vicente Males.      ED Course:  Vital signs in the ED were notable for the following: Tetramex 98.2, heart rate 66-81, blood pressure 122/93 -138/94; respiratory rate 17-21; oxygen saturation 100% on room air.   Labs were notable for the following: CMP was notable for the following: Sodium 140, potassium 4.2, bicarbonate 25, BUN 41, creatinine 1.42 relative to most recent prior serum  creatinine data point of 1.1 in May 2021.  Additionally, today CMP was notable for the following liver enzyme results, which are compared to most recent prior corresponding values that were checked on  02/19/2021: Today's albumin 2.8 compared to prior value of 2.7, alkaline phosphatase 513, down from most recent prior value of 769, AST 59, compared to most recent prior value of 320, ALT 97, down from most recent prior value of 424, total bilirubin 2.9, compared to most recent prior value of 4.7.  Serum magnesium 1.6.  Ammonia 112, with no prior value available for point comparison.  CBC notable for white blood cell count 4600, hemoglobin 10.9.  INR 1.4 compared to 1.2 on 12/18/2020.  High-sensitivity troponin I initially noted to be 62, with repeat value trending down to 60.  Serum ethanol noted to be less than 10.  Urinalysis and urinary drug screen been ordered, with results currently pending.  Screening nasopharyngeal COVID-19 PCR was performed in the ED today, and found to be negative.  EKG showed sinus rhythm with heart rate 68, and no evidence of T wave or ST changes, including no evidence of ST elevation.  Chest x-ray showed evidence of right Port-A-Cath in place, with no evidence of acute cardiopulmonary process, including no evidence of infiltrate, edema, or effusion.  Noncontrast CT of the head showed evidence of small vessel white matter disease, but no evidence of acute intracranial process, including no evidence of acute intracranial hemorrhage or acute ischemic infarct.  CT of the cervical spine showed no evidence of acute fracture or subluxation of the cervical spine.  CTA of the abdomen/pelvis, compared to CT performed on 12/17/2020 at [facility], showed interval development of abdominal ascites.  CTA abdomen/pelvis also noted the presence of a metallic biliary stent, and showed a probable low-density lesion in the left hepatic lobe, which was also noted on most recent prior [facility] ER report.  No evidence of bowel obstruction, perforation, or abscess.  While in the ED, the following were administered: Lactulose x1 dose.  Subsequently, the patient was admitted to the med telemetry floor for further  evaluation and management of suspected presenting acute metabolic encephalopathy.     Review of Systems: As per HPI otherwise 10 point review of systems negative.   Past Medical History:  Diagnosis Date  . Chronic hepatitis C ([place])   . GERD (gastroesophageal reflux disease)   . High cholesterol   . Hypertension   . Rectal bleeding     Past Surgical History:  Procedure Laterality Date  . ABDOMINAL AORTIC ANEURYSM REPAIR    . CHOLECYSTECTOMY    . COLONOSCOPY    . COLONOSCOPY WITH PROPOFOL N/A 08/20/2016   Procedure: COLONOSCOPY WITH PROPOFOL;  Surgeon: Lollie Sails, MD;  Location: [facility] ENDOSCOPY;  Service: Endoscopy;  Laterality: N/A;  . COLONOSCOPY WITH PROPOFOL N/A 11/06/2019   Procedure: COLONOSCOPY WITH PROPOFOL;  Surgeon: Jonathon Bellows, MD;  Location: [facility] ENDOSCOPY;  Service: Gastroenterology;  Laterality: N/A;  . HEMORRHOIDECTOMY WITH HEMORRHOID BANDING    . HEMORROIDECTOMY    . LOWER EXTREMITY ANGIOGRAPHY Right 12/11/2019   Procedure: LOWER EXTREMITY ANGIOGRAPHY;  Surgeon: Algernon Huxley, MD;  Location: [place] CV LAB;  Service: Cardiovascular;  Laterality: Right;  . LOWER EXTREMITY ANGIOGRAPHY Right 12/21/2019   Procedure: LOWER EXTREMITY ANGIOGRAPHY;  Surgeon: Algernon Huxley, MD;  Location: [place] CV LAB;  Service: Cardiovascular;  Laterality: Right;  . surgery left leg      Social History:  reports that he has been  smoking cigarettes. He has a 30.00 pack-year smoking history. He has never used smokeless tobacco. He reports that he does not drink alcohol and does not use drugs.   No Known Allergies  Family History  Problem Relation Age of Onset  . Hypertension Mother   . Diabetes Mother      Prior to Admission medications   Medication Sig Start Date End Date Taking? Authorizing Provider  amLODipine (NORVASC) 10 MG tablet Take 10 mg by mouth daily. 09/25/19   [provider]  aspirin EC 81 MG tablet Take 1 tablet (81 mg total) by mouth daily.  12/21/19   Algernon Huxley, MD  atorvastatin (LIPITOR) 40 MG tablet Take 40 mg by mouth daily.    [provider]  clopidogrel (PLAVIX) 75 MG tablet Take 75 mg by mouth daily.    [provider]  dicyclomine (BENTYL) 10 MG capsule Take 1 capsule (10 mg total) by mouth 4 (four) times daily -  before meals and at bedtime. 03/05/20   Jonathon Bellows, MD  hydrochlorothiazide (HYDRODIURIL) 25 MG tablet Take 25 mg by mouth daily.    [provider]  hydrochlorothiazide (HYDRODIURIL) 25 MG tablet Take 25 mg by mouth daily.    [provider]  lisinopril (PRINIVIL,ZESTRIL) 20 MG tablet Take 20 mg by mouth daily.    [provider]  lisinopril (PRINIVIL,ZESTRIL) 40 MG tablet Take 40 mg by mouth daily.    [provider]  omeprazole (PRILOSEC) 20 MG capsule Take 20 mg by mouth daily. 09/25/19   [provider]  pantoprazole (PROTONIX) 40 MG tablet Take 40 mg by mouth daily.    [provider]  simvastatin (ZOCOR) 20 MG tablet Take 20 mg by mouth daily.    [provider]     Objective    Physical Exam: Vitals:   03/06/21 1330 03/06/21 1500 03/06/21 1530 03/06/21 1543  BP: (!) 131/92 (!) 125/93 117/86   Pulse: 66 65 68   Resp: (!) 7 12 13    Temp:    98.1 F (36.7 C)  TempSrc:    Oral  SpO2: 100% 100% 100%   Weight:      Height:        General: appears to be stated age; somnolent, falling asleep which answering my question; oriented to place, person (correctly ID's current [place] president), but not oriented to time (thinks that it is April of 2020.  Skin: warm, dry, no rash Head:  AT/[place] Mouth:  Oral mucosa membranes appear dry, normal dentition Neck: supple; trachea midline Heart:  RRR; did not appreciate any M/R/G Lungs: CTAB, did not appreciate any wheezes, rales, or rhonchi Abdomen: + BS; soft, mildly distended, NT Vascular: 2+ pedal pulses b/l; 2+ radial pulses b/l Extremities: no peripheral edema, no muscle  wasting Neuro: In the setting of the patient's current mental status and associated limited ability to follow instructions, unable to perform full neurologic exam at this time.  As such, assessment of strength, sensation, and cranial nerves is limited at this time. Patient noted to spontaneously move all 4 extremities. No tremors.    Labs on Admission: I have personally reviewed following labs and imaging studies  CBC: Recent Labs  Lab 03/06/21 0855  WBC 4.6  NEUTROABS 3.3  HGB 10.9*  HCT 32.3*  MCV 92.6  PLT 702*   Basic Metabolic Panel: Recent Labs  Lab 03/06/21 0855  NA 140  K 4.2  CL 107  CO2 25  GLUCOSE 72  BUN 41*  CREATININE 1.42*  CALCIUM 8.7*  MG 1.6*   GFR: Estimated Creatinine Clearance: 41.5 mL/min (A) (by C-G formula based on SCr of 1.42 mg/dL (H)). Liver Function Tests: Recent Labs  Lab 03/06/21 0855  AST 59*  ALT 97*  ALKPHOS 513*  BILITOT 2.9*  PROT 6.8  ALBUMIN 2.8*   Recent Labs  Lab 03/06/21 0855  LIPASE 25   Recent Labs  Lab 03/06/21 0854  AMMONIA 112*   Coagulation Profile: Recent Labs  Lab 03/06/21 0855  INR 1.4*   Cardiac Enzymes: Recent Labs  Lab 03/06/21 0855  CKTOTAL 129   BNP (last 3 results) No results for input(s): PROBNP in the last 8760 hours. HbA1C: No results for input(s): HGBA1C in the last 72 hours. CBG: No results for input(s): GLUCAP in the last 168 hours. Lipid Profile: No results for input(s): CHOL, HDL, LDLCALC, TRIG, CHOLHDL, LDLDIRECT in the last 72 hours. Thyroid Function Tests: No results for input(s): TSH, T4TOTAL, FREET4, T3FREE, THYROIDAB in the last 72 hours. Anemia Panel: No results for input(s): VITAMINB12, FOLATE, FERRITIN, TIBC, IRON, RETICCTPCT in the last 72 hours. Urine analysis: No results found for: COLORURINE, APPEARANCEUR, LABSPEC, PHURINE, GLUCOSEU, HGBUR, BILIRUBINUR, KETONESUR, PROTEINUR, UROBILINOGEN, NITRITE, LEUKOCYTESUR  Radiological Exams on Admission: CT Head Wo  Contrast  Result Date: 03/06/2021 CLINICAL DATA:  Found on floor, fall, pancreatic cancer EXAM: CT HEAD WITHOUT CONTRAST CT CERVICAL SPINE WITHOUT CONTRAST TECHNIQUE: Multidetector CT imaging of the head and cervical spine was performed following the standard protocol without intravenous contrast. Multiplanar CT image reconstructions of the cervical spine were also generated. COMPARISON:  None. FINDINGS: CT HEAD FINDINGS Brain: No evidence of acute infarction, hemorrhage, hydrocephalus, extra-axial collection or mass lesion/mass effect. Periventricular and deep white matter hypodensity 3 Vascular: No hyperdense vessel or unexpected calcification. Skull: Normal. Negative for fracture or focal lesion. Sinuses/Orbits: No acute finding. Other: None. CT CERVICAL SPINE FINDINGS Alignment: Normal. Skull base and vertebrae: No acute fracture. No primary bone lesion or focal pathologic process. Soft tissues and spinal canal: No prevertebral fluid or swelling. No visible canal hematoma. Disc levels: Intact. Moderate multilevel bilateral facet degenerative change. Upper chest: Negative. Other: None. IMPRESSION: 1. No acute intracranial pathology. Small-vessel white matter disease. 2. No fracture or static subluxation of the cervical spine. Moderate multilevel bilateral facet degenerative change. Electronically Signed   By: Eddie Candle M.D.   On: 03/06/2021 12:40   CT Cervical Spine Wo Contrast  Result Date: 03/06/2021 CLINICAL DATA:  Found on floor, fall, pancreatic cancer EXAM: CT HEAD WITHOUT CONTRAST CT CERVICAL SPINE WITHOUT CONTRAST TECHNIQUE: Multidetector CT imaging of the head and cervical spine was performed following the standard protocol without intravenous contrast. Multiplanar CT image reconstructions of the cervical spine were also generated. COMPARISON:  None. FINDINGS: CT HEAD FINDINGS Brain: No evidence of acute infarction, hemorrhage, hydrocephalus, extra-axial collection or mass lesion/mass effect.  Periventricular and deep white matter hypodensity 3 Vascular: No hyperdense vessel or unexpected calcification. Skull: Normal. Negative for fracture or focal lesion. Sinuses/Orbits: No acute finding. Other: None. CT CERVICAL SPINE FINDINGS Alignment: Normal. Skull base and vertebrae: No acute fracture. No primary bone lesion or focal pathologic process. Soft tissues and spinal canal: No prevertebral fluid or swelling. No visible canal hematoma. Disc levels: Intact. Moderate multilevel bilateral facet degenerative change. Upper chest: Negative. Other: None. IMPRESSION: 1. No acute intracranial pathology. Small-vessel white matter disease. 2. No fracture or static subluxation of the cervical spine. Moderate multilevel bilateral facet degenerative change. Electronically Signed  By: Eddie Candle M.D.   On: 03/06/2021 12:40   DG Chest Portable 1 View  Result Date: 03/06/2021 CLINICAL DATA:  70 year old male with pancreatic cancer, found down. Lethargy. EXAM: PORTABLE CHEST 1 VIEW COMPARISON:  CT chest 10/28/2020. FINDINGS: Portable AP upright view at 0853 hours. New right chest Port-A-Cath. Normal cardiac size and mediastinal contours. Visualized tracheal air column is within normal limits. Allowing for portable technique the lungs are clear. No pneumothorax. No acute osseous abnormality identified. Stable cholecystectomy clips. Paucity of bowel gas in the upper abdomen. IMPRESSION: Negative portable chest with right side Port-A-Cath in place. Electronically Signed   By: Genevie Ann M.D.   On: 03/06/2021 09:07   CT Angio Abd/Pel w/ and/or w/o  Result Date: 03/06/2021 CLINICAL DATA:  71 year old with suspected abdominal mass. History of pancreatic cancer. EXAM: CTA ABDOMEN AND PELVIS WITH CONTRAST TECHNIQUE: Multidetector CT imaging of the abdomen and pelvis was performed using the standard protocol during bolus administration of intravenous contrast. Multiplanar reconstructed images and MIPs were obtained and  reviewed to evaluate the vascular anatomy. CONTRAST:  7mL OMNIPAQUE IOHEXOL 350 MG/ML SOLN COMPARISON:  CT report from 12/17/2020 from [place]. 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 Aortic stent below the SMA. Small amount of mural thrombus within the main aortic graft body but no significant stenosis. Abdominal aortic aneurysm roughly measures 2.5 x 2.6 cm on sequence 5 5, image 144. Limited evaluation for an endoleak due to the study technique. No evidence for aortic rupture. Celiac: Main celiac trunk is patent and small. Main branches coming off the main celiac trunk are the common hepatic artery and the splenic artery. The splenic artery has a very beaded configuration and could represent fibromuscular dysplasia. The left gastric artery originates just proximal to the celiac trunk and there is a replaced left hepatic artery. SMA: SMA is patent with a replaced right hepatic artery. Renals: Left renal artery is widely patent without aneurysm, dissection or significant stenosis. Right renal artery is patent but difficult to exclude mild narrowing near the origin. In addition, there is a small accessory right renal artery supplying the lower pole that is likely excluded from the aortic stent graft with retrograde flow. IMA: Origin is occluded due to the aortic stent graft. There is reconstitution of IMA branches and limited evaluation for endoleak. Inflow: Bifurcated aortic stent graft extends into the bilateral iliac arteries. Left-sided stents terminate near the distal left common iliac artery. The distal left common iliac artery measures up to 2.3 cm without a significant aneurysm sac. Patient has stents involving the right external and right internal iliac artery. Bilateral internal iliac arteries are patent. There appears to be a saccular type aneurysm involving the left external iliac artery that roughly measures 1 cm on  sequence 5, image 178. Atherosclerotic plaque and stenosis in the distal right external iliac artery. Proximal Outflow: Limited evaluation of the proximal outflow vessels due to timing of the examination. There appears to be bypass graft in the left groin. Limited evaluation of this left femoral bypass. Atherosclerotic plaque involving the right common femoral artery. Veins: Limited evaluation due to the phase of contrast. Review of the MIP images confirms the above findings. NON-VASCULAR Lower chest: Tiny nodules in the right lower lobe were probably present on the exam from 10/28/2020. Small amount of dependent atelectasis in the right lower lobe. No large pleural effusions. Underlying emphysematous changes in the lungs. Hepatobiliary: Large amount of  perihepatic ascites. There is a metallic biliary stent with pneumobilia. Limited evaluation for hepatic lesions to the phase of contrast but there is subtle low-density in left hepatic lobe near segment 2 and segment 4A that measures roughly 3.7 cm. Based on the previous [facility] report, patient had lesion in this area. Difficult to exclude interval enlargement based on these images. Pancreas: Limited evaluation due to the phase of contrast. However, there are at least 3 cystic areas near the pancreatic tail region that are most likely related to the known mass in this area. Largest cystic area measures up to 3.4 cm on sequence 12, image 6. The largest cystic area is probably abutting or involving the posterior gastric wall. Spleen: Spleen size is within normal limits. Adrenals/Urinary Tract: Limited evaluation of the adrenal glands. Atrophy or old infarct in the inferior right kidney related to the excluded right accessory renal artery. Stomach/Bowel: No significant dilatation of bowel structures. Very limited evaluation for bowel inflammation due to phase of contrast. Cystic lesion near the pancreatic tail appears to be involving or abutting the posterior wall the  stomach. Lymphatic: Soft tissue fullness in the left periaortic region that could represent lymphadenopathy but poorly characterized. Abnormal stranding or soft tissue in the upper abdomen near the pancreas Reproductive: Limited evaluation of the prostate. Large fluid collection on the right side of scrotum could represent a large hydrocele. Other: Large amount of ascites in the abdomen and pelvis. Limited evaluation for peritoneal or omental disease on this examination. Musculoskeletal: No acute bone abnormality. IMPRESSION: VASCULAR 1. Bifurcated aortic stent graft is patent. Limited evaluation of the aneurysm sac and limited evaluation for an endoleak. 2. 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 Variant celiac artery anatomy as described. 3. Atrophy or scarring involving the right kidney lower pole likely related to the excluded accessory right renal artery. 4. 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 NON-VASCULAR 1. Large amount of abdominal and pelvic ascites. This appears to be a new finding based on the previous report. 2. Limited evaluation of the patient's known pancreatic mass. Poorly visualized cystic lesions along the posterior wall the stomach and near the pancreatic tail likely represent the known pancreatic lesion. In addition there, is abnormal soft tissue near the celiac trunk and periaortic regions that could represent lymphadenopathy. Difficult to exclude omental thickening on this examination. 3. Metallic biliary stent with pneumobilia. 4. Probable low-density lesion in left hepatic lobe which corresponds with the previous report from [facility]. This area is poorly defined but may have enlarged since the previous examination. Findings are worrisome for progression of disease. 5. Probable right hydrocele. Electronically Signed   By: Markus Daft M.D.   On: 03/06/2021 13:09     EKG: Independently reviewed, with result as described above.     Assessment/Plan   Clayton Gill is a 70 y.o. male with medical history significant for recently diagnosed pancreatic cancer on chemotherapy, biliary obstruction status post placement of biliary stent, hypertension, hyperlipidemia, chronic hepatitis C, AAA status post repair, who is admitted to [facility] on 03/06/2021 with acute metabolic encephalopathy after presenting from home to [facility] ED for evaluation of altered mental status.    Principal Problem:   Acute metabolic encephalopathy Active Problems:   Essential (primary) hypertension   AKI (acute kidney injury) ([place])   Elevated transaminase level   Hypomagnesemia   Elevated troponin    #) Acute metabolic encephalopathy: 1 to 2 days of progressive confusion and somnolence.  Suspected to be metabolic in nature, with potentially multifactorial contributions that include acute kidney injury as well as suspected contributory element of dehydration given recent decline in oral intake.  There is also the possibility of underlying infection, given the patient's reported recent dysuria, with urinalysis result currently pending.  Additionally, interval development of ascites, as described above, raises the possibility of SBP in the context of presenting acute encephalopathy.  Of note, chest x-ray showed no evidence of acute cardiopulmonary process, including no evidence of infiltrate, while COVID-19 PCR performed today was found to be negative.  Patient noted to be moving all 4 extremities, rendering acute CVA to be less likely, while presenting CT head showed no evidence of intracranial process, including no evidence of intracranial hemorrhage.  There may also be a pharmacologic contribution given that patient presents with a transdermal fentanyl patch, as there may be diminished hepatic metabolism of this medication as well as diminished renal clearance given associated presenting AKI.  Patient's liver enzymes appear to be  trending down relative to most recent prior values on 02/19/2021, including improvement of prior cholestatic pattern.  We will closely monitor interval trend of these enzymes to determine need for inpatient GI input.  Hepatic encephalopathy is also in the differential, although this represents a diagnosis of exclusion.  We will continue to trend INR is a [place] evaluating hepatic synthetic function and correlating this with his liver enzymes as well as liver overall clinical picture to further evaluate this possibility.    Plan: Follow for result of urinalysis as well as urinary drug screen.  I have asked the patient's nurse to remove his fentanyl patch at this time.  Repeat CMP in the morning.  Check GGT and direct bilirubin level.  Repeat CBC.  Check MMA level as well as TSH.  Repeat INR.  Work-up and management of acute kidney injury, as further described below, including that of gentle IV fluids overnight.  Check TSH check VBG to evaluate for any contribution from hypercapnic encephalopathy given the patient's long smoking history.  Fall precautions.  Inpatient pharmacy consult placed for assistance with ensuring accurate outpatient medication reconciliation.  As needed Narcan.  Check ionized calcium. Diagnostic paracentesis with associated labs ordered, including assessment for SBP.       #) Acute kidney injury: Relative to most recent prior creatinine of 1.1, presenting creatinine elevated at 1.42.  Suspect that this is prerenal in nature, although it is currently unclear if this is prerenal etiology stems from intravascular depletion due to dehydration in the setting of patient's report of recent decline in oral intake versus prerenal factors relative to underlying liver disease.  Will provide gentle IV fluids overnight, with interval trending of renal function including associated response to these fluids to assist with further assessment and delineation within these possibilities.  Additional  potential pharmacologic exacerbation from home lisinopril.  Plan: Check urinalysis with attention for the presence of urinary casts.  Add on random urine sodium as well as random urine creatinine.  Monitor strict I's and O's and daily weights.  Gentle IV fluids overnight in the form of lactated Ringer's at 50 cc/h x 12 hours.  Attempt avoid nephrotoxic agents.  Repeat CMP in the morning.  Hold home lisinopril.  Additionally, will hold home HCTZ for now given suspected intravascular depletion as a consequence of dehydration.      #) Hypomagnesemia: Presenting serum magnesium mildly low at 1.6.  Plan: Magnesium sulfate 2 g IV over 2 hours x 1 dose now.  Monitor on telemetry.  Repeat serum magnesium level in the morning.       #) Elevated troponin: mildly elevated initial troponin of 62, which has subsequently trended down to 60, with no prior high-sensitivity troponin I value available for point comparison.  Suspect that this mildly elevated troponin is on the basis of supply demand mismatch with diminished renal clearance in the setting of presenting acute kidney injury as opposed to representing a type I process due to acute plaque rupture.  EKG shows no evidence of acute ischemic changes, including no evidence of STEMI, while chest x-ray showed no evidence of acute cardiopulmonary process, including no evidence of pneumothorax.  Additionally, the patient denies any recent chest pain.  Overall, ACS is felt to be less likely relative to a type II supply demand mismatch.    Plan: Monitor on telemetry. PRN EKG for development of chest pain. Check serum Mg level and repeat CMP.  Evaluation and management presenting acute kidney injury, as above.  Repeat CBC in the morning.  Monitor on continuous pulse oximetry.       #) Essential hypertension: Outpatient antihypertensive regimen includes HCTZ, Norvasc, and lisinopril.  Presenting blood pressures appear to be normotensive in nature.  In the ED  context of presenting acute kidney injury as well as suspected dehydration, will hold lisinopril and HCTZ for now.  Additionally, will hold Norvasc pending additional infectious work-up, as further described above.  Plan:will hold home antihypertensive medications for now, as above.  Close monitoring of ensuing blood pressure via routine vital signs.  Monitor strict I's and O's and daily weights.      #) Chronic tobacco abuse: Patient acknowledges that he is a current smoker, having smoked between half pack per day to three quarters of a pack per day over the last 40 years.  Plan: Counseled the patient on the importance of complete smoking discontinuation.  Have also ordered a as needed nicotine patch for use during this hospitalization.     DVT prophylaxis: scd's  Code Status: Full code Family Communication: none Disposition Plan: Per Rounding Team Consults called: none  Admission status: inpatient; med-tele.    Of note, this patient was added by me to the following Admit List/Treatment Team: [facility].      PLEASE NOTE THAT DRAGON DICTATION SOFTWARE WAS USED IN THE CONSTRUCTION OF THIS NOTE.   [place] [facility] Pager [phone] From 12PM - 12AM  Otherwise, please contact night-coverage  [email or web address] Password [email or web address]   03/06/2021, 4:10 PM

## 2021-03-06 NOTE — ED Triage Notes (Signed)
Pt to ED from home via [place] by friend who found pt on floor last night, believed to have fallen Pancreatic cancer pt Pt was found to be lethargic this morning by friends Unwitnessed fall last night, does not remember falling Pt takes Plavix Missed last 2 chemo treatments, today and 2 weeks ago. No fever, VS normal Pt is from home, came via [facility] EDP at bedside

## 2021-03-07 ENCOUNTER — Inpatient Hospital Stay: Payer: Medicare HMO

## 2021-03-07 DIAGNOSIS — C259 Malignant neoplasm of pancreas, unspecified: Secondary | ICD-10-CM

## 2021-03-07 DIAGNOSIS — G9341 Metabolic encephalopathy: Secondary | ICD-10-CM | POA: Diagnosis not present

## 2021-03-07 DIAGNOSIS — E43 Unspecified severe protein-calorie malnutrition: Secondary | ICD-10-CM | POA: Insufficient documentation

## 2021-03-07 DIAGNOSIS — C787 Secondary malignant neoplasm of liver and intrahepatic bile duct: Secondary | ICD-10-CM

## 2021-03-07 LAB — COMPREHENSIVE METABOLIC PANEL
ALT: 94 U/L — ABNORMAL HIGH (ref 0–44)
AST: 59 U/L — ABNORMAL HIGH (ref 15–41)
Albumin: 2.7 g/dL — ABNORMAL LOW (ref 3.5–5.0)
Alkaline Phosphatase: 486 U/L — ABNORMAL HIGH (ref 38–126)
Anion gap: 7 (ref 5–15)
BUN: 53 mg/dL — ABNORMAL HIGH (ref 8–23)
CO2: 25 mmol/L (ref 22–32)
Calcium: 8.8 mg/dL — ABNORMAL LOW (ref 8.9–10.3)
Chloride: 107 mmol/L (ref 98–111)
Creatinine, Ser: 1.36 mg/dL — ABNORMAL HIGH (ref 0.61–1.24)
GFR, Estimated: 57 mL/min — ABNORMAL LOW (ref >60)
Glucose, Bld: 84 mg/dL (ref 70–99)
Potassium: 4.2 mmol/L (ref 3.5–5.1)
Sodium: 139 mmol/L (ref 135–145)
Total Bilirubin: 2.6 mg/dL — ABNORMAL HIGH (ref 0.3–1.2)
Total Protein: 6.4 g/dL — ABNORMAL LOW (ref 6.5–8.1)

## 2021-03-07 LAB — PROTIME-INR
INR: 1.6 — ABNORMAL HIGH (ref 0.8–1.2)
Prothrombin Time: 18.3 seconds — ABNORMAL HIGH (ref 11.4–15.2)

## 2021-03-07 LAB — URINE DRUG SCREEN, QUALITATIVE (ARMC ONLY)
Amphetamines, Ur Screen: NOT DETECTED
Barbiturates, Ur Screen: NOT DETECTED
Benzodiazepine, Ur Scrn: NOT DETECTED
Cannabinoid 50 Ng, Ur ~~LOC~~: NOT DETECTED
Cocaine Metabolite,Ur ~~LOC~~: NOT DETECTED
MDMA (Ecstasy)Ur Screen: NOT DETECTED
Methadone Scn, Ur: NOT DETECTED
Opiate, Ur Screen: NOT DETECTED
Phencyclidine (PCP) Ur S: NOT DETECTED
Tricyclic, Ur Screen: NOT DETECTED

## 2021-03-07 LAB — BODY FLUID CELL COUNT WITH DIFFERENTIAL
Eos, Fluid: 0 %
Lymphs, Fluid: 68 %
Monocyte-Macrophage-Serous Fluid: 15 %
Neutrophil Count, Fluid: 17 %
Total Nucleated Cell Count, Fluid: 240 cu mm

## 2021-03-07 LAB — BILIRUBIN, DIRECT: Bilirubin, Direct: 1.2 mg/dL — ABNORMAL HIGH (ref 0.0–0.2)

## 2021-03-07 LAB — LACTATE DEHYDROGENASE, PLEURAL OR PERITONEAL FLUID: LD, Fluid: 46 U/L — ABNORMAL HIGH (ref 3–23)

## 2021-03-07 LAB — URINALYSIS, COMPLETE (UACMP) WITH MICROSCOPIC
Bacteria, UA: NONE SEEN
Bilirubin Urine: NEGATIVE
Glucose, UA: NEGATIVE mg/dL
Ketones, ur: NEGATIVE mg/dL
Leukocytes,Ua: NEGATIVE
Nitrite: NEGATIVE
Protein, ur: NEGATIVE mg/dL
Specific Gravity, Urine: 1.039 — ABNORMAL HIGH (ref 1.005–1.030)
pH: 5 (ref 5.0–8.0)

## 2021-03-07 LAB — SODIUM, URINE, RANDOM: Sodium, Ur: 14 mmol/L

## 2021-03-07 LAB — HIV ANTIBODY (ROUTINE TESTING W REFLEX): HIV Screen 4th Generation wRfx: NONREACTIVE

## 2021-03-07 LAB — GLUCOSE, PLEURAL OR PERITONEAL FLUID: Glucose, Fluid: 165 mg/dL

## 2021-03-07 LAB — CBC
HCT: 32.1 % — ABNORMAL LOW (ref 39.0–52.0)
Hemoglobin: 10.9 g/dL — ABNORMAL LOW (ref 13.0–17.0)
MCH: 30.9 pg (ref 26.0–34.0)
MCHC: 34 g/dL (ref 30.0–36.0)
MCV: 90.9 fL (ref 80.0–100.0)
Platelets: 101 10*3/uL — ABNORMAL LOW (ref 150–400)
RBC: 3.53 MIL/uL — ABNORMAL LOW (ref 4.22–5.81)
RDW: 15.8 % — ABNORMAL HIGH (ref 11.5–15.5)
WBC: 5.4 10*3/uL (ref 4.0–10.5)
nRBC: 0 % (ref 0.0–0.2)

## 2021-03-07 LAB — PROTEIN, PLEURAL OR PERITONEAL FLUID: Total protein, fluid: <3 g/dL

## 2021-03-07 LAB — LACTATE DEHYDROGENASE: LDH: 296 U/L — ABNORMAL HIGH (ref 98–192)

## 2021-03-07 LAB — MAGNESIUM: Magnesium: 1.9 mg/dL (ref 1.7–2.4)

## 2021-03-07 LAB — ALBUMIN, PLEURAL OR PERITONEAL FLUID: Albumin, Fluid: <1 g/dL

## 2021-03-07 LAB — CREATININE, URINE, RANDOM: Creatinine, Urine: 158 mg/dL

## 2021-03-07 LAB — TSH: TSH: 2.696 u[IU]/mL (ref 0.350–4.500)

## 2021-03-07 LAB — GAMMA GT: GGT: 246 U/L — ABNORMAL HIGH (ref 7–50)

## 2021-03-07 MED ORDER — LACTATED RINGERS IV SOLN: INTRAVENOUS | Status: AC

## 2021-03-07 MED ORDER — BOOST / RESOURCE BREEZE PO LIQD CUSTOM
1.0000 | Freq: Three times a day (TID) | ORAL | Status: DC
  Administered 2021-03-07 – 2021-03-10 (×9): 1 via ORAL

## 2021-03-07 MED ORDER — CHLORHEXIDINE GLUCONATE CLOTH 2 % EX PADS
6.0000 | MEDICATED_PAD | Freq: Every day | CUTANEOUS | Status: DC
  Administered 2021-03-08 – 2021-03-13 (×7): 6 via TOPICAL

## 2021-03-07 NOTE — Evaluation (Signed)
Physical Therapy Evaluation Patient Details Name: Clayton Gill MRN: [id number] DOB: June 25, 1951 Today's Date: 03/07/2021   History of Present Illness  Pt is a 70 y.o. male with medical history significant for recently diagnosed pancreatic cancer on chemotherapy, biliary obstruction status post placement of biliary stent, hypertension, hyperlipidemia, chronic hepatitis C, AAA status post repair, who is admitted to [facility] on 03/06/2021 with acute metabolic encephalopathy after presenting from home to [facility] ED for evaluation of altered mental status. MD assessment includes: Acute metabolic encephalopathy, AKI, Hypomagnesemia, Elevated troponin (asymptomatic without any chest pain, likely due to demand ischemia), abdominal swelling likely with US guided paracentesis ordered, and HTN.    Clinical Impression  Pt cachectic in appearance and lethargic but became more alert as the session progressed.  Pt required extra time and effort during all functional tasks as well as min physical assistance with bed mobility tasks.  Pt was only able to amb a max of 15' with significant effort with slow cadence and short B step length.  Pt stated that he lives with a friend with intermittent assist and would not be safe to return to his prior living situation at this time.  Pt will benefit from PT services in a SNF setting upon discharge to safely address deficits listed in patient problem list for decreased caregiver assistance and eventual return to PLOF.     Follow Up Recommendations SNF;Supervision for mobility/OOB    Equipment Recommendations  Rolling walker with 5" wheels;3in1 (PT)    Recommendations for Other Services       Precautions / Restrictions Precautions Precautions: Fall Restrictions Weight Bearing Restrictions: No      Mobility  Bed Mobility Overal bed mobility: Needs Assistance Bed Mobility: Rolling;Sidelying to Sit;Sit to Sidelying Rolling: Min  assist Sidelying to sit: Min assist     Sit to sidelying: Min assist General bed mobility comments: Min A for BLE and trunk control with log roll training provided for decreased caregiver assistance    Transfers Overall transfer level: Needs assistance Equipment used: Rolling walker (2 wheeled) Transfers: Sit to/from Stand Sit to Stand: Min guard         General transfer comment: Extra time and effort required to come to standing but no physical assist needed  Ambulation/Gait Ambulation/Gait assistance: Min guard Gait Distance (Feet): 15 Feet Assistive device: Rolling walker (2 wheeled) Gait Pattern/deviations: Step-through pattern;Decreased step length - right;Decreased step length - left;Trunk flexed Gait velocity: decreased   General Gait Details: Very slow cadence with short B step length with pt able to amb a max of 15'; min to mod lean on the RW for support but no LOB  Financial trader Rankin (Stroke Patients Only)       Balance Overall balance assessment: Needs assistance   Sitting balance-Leahy Scale: Good     Standing balance support: Bilateral upper extremity supported;During functional activity Standing balance-Leahy Scale: Fair Standing balance comment: Min to mod lean on the RW for support                             Pertinent Vitals/Pain Pain Assessment: 0-10 Pain Score: 7  Pain Location: abdominal pain Pain Descriptors / Indicators: Aching Pain Intervention(s): Monitored during session;Patient requesting pain meds-RN notified;Repositioned    Home Living Family/patient expects to be discharged to:: Private residence Living Arrangements: Alone Available Help at Discharge:  Friend(s);Available PRN/intermittently Type of Home: Apartment Home Access: Level entry     Home Layout: One level Home Equipment: Shower seat;Cane - single point;Grab bars - toilet;Grab bars - tub/shower      Prior  Function Level of Independence: Independent         Comments: Ind amb community distances, no fall history, Ind with ADLs     Hand Dominance        Extremity/Trunk Assessment   Upper Extremity Assessment Upper Extremity Assessment: Generalized weakness    Lower Extremity Assessment Lower Extremity Assessment: Generalized weakness       Communication   Communication: No difficulties  Cognition Arousal/Alertness: Lethargic Behavior During Therapy: WFL for tasks assessed/performed Overall Cognitive Status: Within Functional Limits for tasks assessed                                        General Comments      Exercises Other Exercises Other Exercises: Log roll training provided Other Exercises: HEP education and review for BLE APs, QS, and GS x 10 each every 1-2 hours daily Other Exercises: Static unsupported sitting at EOB for improved activity tolerance and core strengthening   Assessment/Plan    PT Assessment Patient needs continued PT services  PT Problem List Decreased strength;Decreased activity tolerance;Decreased balance;Decreased mobility;Decreased knowledge of use of DME       PT Treatment Interventions DME instruction;Gait training;Functional mobility training;Therapeutic activities;Therapeutic exercise;Balance training;Patient/family education    PT Goals (Current goals can be found in the Care Plan section)  Acute Rehab PT Goals Patient Stated Goal: To get stronger PT Goal Formulation: With patient Time For Goal Achievement: 03/18/2021 Potential to Achieve Goals: Fair    Frequency Min 2X/week   Barriers to discharge Decreased caregiver support      Co-evaluation               AM-PAC PT "6 Clicks" Mobility  Outcome Measure Help needed turning from your back to your side while in a flat bed without using bedrails?: A Little Help needed moving from lying on your back to sitting on the side of a flat bed without using  bedrails?: A Little Help needed moving to and from a bed to a chair (including a wheelchair)?: A Little Help needed standing up from a chair using your arms (e.g., wheelchair or bedside chair)?: A Little Help needed to walk in hospital room?: A Little Help needed climbing 3-5 steps with a railing? : A Lot 6 Click Score: 17    End of Session Equipment Utilized During Treatment: Gait belt Activity Tolerance: Patient tolerated treatment well Patient left: in bed;with call bell/phone within reach;with bed alarm set;Other (comment) (Pt declined up in chair) Nurse Communication: Mobility status PT Visit Diagnosis: Muscle weakness (generalized) (M62.81);Difficulty in walking, not elsewhere classified (R26.2)    Time: 1421-1450 PT Time Calculation (min) (ACUTE ONLY): 29 min   Charges:   PT Evaluation $PT Eval Moderate Complexity: 1 Mod PT Treatments $Therapeutic Exercise: 8-22 mins        D. Royetta Asal PT, DPT 03/07/21, 3:29 PM

## 2021-03-07 NOTE — Progress Notes (Signed)
Patient found to be increasingly lethargic around 2200. Patient had delayed responses, however was able to follow commands. Respirations were at 7. Charge nurse notified of change in status. Dr. Velia Meyer was messaged and informed. Narcan was subsequently ordered. TA requested the MD to come and assess patient. At the bedside, the MD requested Narcan be administered despite the patient waking up more. Narcan was then given. About five minutes after the administration, the patient became agitated and confused stating he should be in "[place]." Straight cath done per order and UA was collected and sent. Patients respirations went as high as 22, VSS, no acute medical complaints at this time.

## 2021-03-07 NOTE — Procedures (Signed)
PROCEDURE SUMMARY:  Successful US guided paracentesis from right abdomen.  Yielded 700 ml of clear yellow fluid.  No immediate complications.  Pt tolerated well.   Specimen sent for labs.  EBL < 2 mL  Theresa Duty, NP 03/07/2021 2:50 PM

## 2021-03-07 NOTE — Assessment & Plan Note (Addendum)
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  Status post Narcan currently improved.  Stable.  #Metastatic pancreatic adenocarcinoma metastatic liver currently-s/p 1 cycle of FOLFIRINOX chemotherapy [complicated by hospitalization]; current chemotherapy on hold because of acute issues.  Long discussion with patient regarding potential for significant side effects given his poor performance status/multiple comorbidities.  Recommend evaluation with palliative care versus goals of care.  Discussed with [employer].  Discussed with Dr. Jimmye Norman.

## 2021-03-07 NOTE — Progress Notes (Signed)
Initial Nutrition Assessment  DOCUMENTATION CODES:   Severe malnutrition in context of chronic illness,Underweight  INTERVENTION:   Encourage PO intake  Boost Breeze po TID, each supplement provides 250 kcal and 9 grams of protein  Magic cup TID with meals, each supplement provides 290 kcal and 9 grams of protein    NUTRITION DIAGNOSIS:   Severe Malnutrition related to chronic illness (pancreatic cancer) as evidenced by severe fat depletion,severe muscle depletion.  GOAL:   Patient will meet greater than or equal to 90% of their needs  MONITOR:   PO intake,Supplement acceptance  REASON FOR ASSESSMENT:   Malnutrition Screening Tool    ASSESSMENT:   Pt with PMH of recent pancreatic cancer dx started first chemotherapy one week ago, biliary obstruction s/p placement of biliary stent, chronic smoker, HTN, HLD, chronic hepatitis C, AAA s/p repair now admitted to [facility] with acute metabolic encephalopathy.Neighbor is responsible for taking pt to oncology appointments and found pt lethargic.   Pt required narcan overnight.  Ammonia level elevated, pt on lactulose  Spoke with pt who does not provide a lot of history and repeats some of his statements. Pt eating lunch without difficulty. Pt feels ensure hurts his stomach but is open to trying magic cup and boost breeze.  Pt unable to provide specifics about weight and intake history. Appears pt lives along and neighbor assists unsure of how much help he gets with grocery shopping, cooking, etc.   Medications reviewed and include: lactulose Labs reviewed: BUN: 53, ammonia 112    NUTRITION - FOCUSED PHYSICAL EXAM:  Flowsheet Row Most Recent Value  Orbital Region Severe depletion  Upper Arm Region Severe depletion  Thoracic and Lumbar Region Severe depletion  Buccal Region Severe depletion  Temple Region Severe depletion  Clavicle Bone Region Severe depletion  Clavicle and Acromion Bone Region Severe depletion  Scapular  Bone Region Severe depletion  Dorsal Hand Severe depletion  Patellar Region Severe depletion  Anterior Thigh Region Severe depletion  Posterior Calf Region Severe depletion  Edema (RD Assessment) None  Hair Reviewed  [bald]  Eyes Reviewed  Mouth Reviewed  Skin Reviewed  Nails Reviewed       Diet Order:   Diet Order            Diet regular Room service appropriate? Yes; Fluid consistency: Thin  Diet effective now                 EDUCATION NEEDS:   Education needs have been addressed  Skin:  Skin Assessment: Reviewed RN Assessment  Last BM:  4/1  Height:   Ht Readings from Last 1 Encounters:  03/06/21 6\' 1"  (1.854 m)    Weight:   Wt Readings from Last 1 Encounters:  03/07/21 56.5 kg    Ideal Body Weight:  83.6 kg  BMI:  Body mass index is 16.43 kg/m.  Estimated Nutritional Needs:   Kcal:  1900-2100  Protein:  95-115 grams  Fluid:  >1.9 L/day  Lockie Pares., RD, LDN, CNSC See AMiON for contact information

## 2021-03-07 NOTE — Progress Notes (Signed)
PROGRESS NOTE    [name]  EHU:[id number] DOB: 09-Jan-1951 DOA: 03/06/2021 PCP: Theotis Burrow, MD    Brief Narrative:  Clayton Gill is a 70 y.o. male with medical history significant for recently diagnosed pancreatic cancer on chemotherapy, biliary obstruction status post placement of biliary stent, hypertension, hyperlipidemia, chronic hepatitis C, AAA status post repair, who is admitted to [facility] on 03/06/2021 with acute metabolic encephalopathy after presenting from home to [facility] ED for evaluation of altered mental status. he was diagnosed with pancreatic cancer approximately 3 weeks ago, with interval initiation of chemotherapy x 1 session, which he reports occurred 1 week ago.  The patient was reportedly scheduled for his next chemo therapy session today.  When the patient's neighbor, who was responsible for taking the patient to this chemotherapy session, arrived at the patient's home, he found the patient to be laying in bed and noted him to be somnolent, lethargic, and mildly confused relative to baseline, prompting the patient to be brought to [facility] ED for further evaluation of such.  The patient's friend also noted that the patient had fallen yesterday, although the details of this fall are unclear as it was unwitnessed by the patient's friend.  The patient does not recall the details relating to this fall, and is unsure if he lost consciousness or if he hit his head. Pt all c/o abdominal swelling . Per chart review, it appears that the patient has a history of biliary obstruction, status post placement of metallic biliary stent.  Most recent prior liver enzymes, per chart review, were drawn on 02/19/2021 and were notable for the following: Albumin 2.7, alkaline phosphatase 769, AST 320, ALT 424, total bilirubin 4.7.While in the ED, the following were administered: Lactulose x1 dose.  Subsequently, the patient was admitted to the med telemetry floor for  further evaluation and management of suspected presenting acute metabolic encephalopathy   Per nsg: 3am today Patient found to be increasingly lethargic around 2200. Patient had delayed responses, however was able to follow commands. Respirations were at 7.Narcan was given and pt improved.  4/1- family at bedside. Pt MS at baseline. Communicative  And oriented.  Consultants:     Procedures:   Antimicrobials:       Subjective: Pt without any complaints this am. Ate breakfast.   Objective: Vitals:   03/06/21 2355 03/07/21 0354 03/07/21 0417 03/07/21 0723  BP: 127/85 105/90  104/75  Pulse: 95 77  82  Resp: (!) 22 16  14   Temp:  97.9 F (36.6 C)  98 F (36.7 C)  TempSrc:  Oral  Oral  SpO2: 100% 100%  100%  Weight:   56.5 kg   Height:        Intake/Output Summary (Last 24 hours) at 03/07/2021 0811 Last data filed at 03/06/2021 2341 Gross per 24 hour  Intake 145.56 ml  Output 200 ml  Net -54.44 ml   Filed Weights   03/06/21 0846 03/07/21 0417  Weight: 59 kg 56.5 kg    Examination:  General exam: Appears calm and comfortable  Respiratory system: Clear to auscultation. Respiratory effort normal. Cardiovascular system: S1 & S2 heard, RRR. No JVD, murmurs, rubs, gallops or clicks. Gastrointestinal system: Abdomen is distended, mild fluid shift, positive bowel sounds nontender normal bowel sounds heard. Central nervous system: Alert and oriented.  Grossly intact Extremities: no edema Skin: Warm dry Psychiatry: Judgement and insight appear normal. Mood & affect appropriate.     Data Reviewed: I have personally reviewed  following labs and imaging studies  CBC: Recent Labs  Lab 03/06/21 0855 03/07/21 0522  WBC 4.6 5.4  NEUTROABS 3.3  --   HGB 10.9* 10.9*  HCT 32.3* 32.1*  MCV 92.6 90.9  PLT 100* 428*   Basic Metabolic Panel: Recent Labs  Lab 03/06/21 0855 03/07/21 0522  NA 140 139  K 4.2 4.2  CL 107 107  CO2 25 25  GLUCOSE 72 84  BUN 41* 53*   CREATININE 1.42* 1.36*  CALCIUM 8.7* 8.8*  MG 1.6* 1.9   GFR: Estimated Creatinine Clearance: 41.5 mL/min (A) (by C-G formula based on SCr of 1.36 mg/dL (H)). Liver Function Tests: Recent Labs  Lab 03/06/21 0855 03/07/21 0522  AST 59* 59*  ALT 97* 94*  ALKPHOS 513* 486*  BILITOT 2.9* 2.6*  PROT 6.8 6.4*  ALBUMIN 2.8* 2.7*   Recent Labs  Lab 03/06/21 0855  LIPASE 25   Recent Labs  Lab 03/06/21 0854  AMMONIA 112*   Coagulation Profile: Recent Labs  Lab 03/06/21 0855 03/07/21 0522  INR 1.4* 1.6*   Cardiac Enzymes: Recent Labs  Lab 03/06/21 0855  CKTOTAL 129   BNP (last 3 results) No results for input(s): PROBNP in the last 8760 hours. HbA1C: No results for input(s): HGBA1C in the last 72 hours. CBG: Recent Labs  Lab 03/06/21 2228  GLUCAP 133*   Lipid Profile: No results for input(s): CHOL, HDL, LDLCALC, TRIG, CHOLHDL, LDLDIRECT in the last 72 hours. Thyroid Function Tests: Recent Labs    03/07/21 0522  TSH 2.696   Anemia Panel: No results for input(s): VITAMINB12, FOLATE, FERRITIN, TIBC, IRON, RETICCTPCT in the last 72 hours. Sepsis Labs: No results for input(s): PROCALCITON, LATICACIDVEN in the last 168 hours.  Recent Results (from the past 240 hour(s))  Resp Panel by RT-PCR (Flu A&B, Covid) Nasopharyngeal Swab     Status: None   Collection Time: 03/06/21  8:48 AM   Specimen: Nasopharyngeal Swab; Nasopharyngeal(NP) swabs in vial transport medium  Result Value Ref Range Status   SARS Coronavirus 2 by RT PCR NEGATIVE NEGATIVE Final    Comment: (NOTE) SARS-CoV-2 target nucleic acids are NOT DETECTED.  The SARS-CoV-2 RNA is generally detectable in upper respiratory specimens during the acute phase of infection. The lowest concentration of SARS-CoV-2 viral copies this assay can detect is 138 copies/mL. A negative result does not preclude SARS-Cov-2 infection and should not be used as the sole basis for treatment or other patient management  decisions. A negative result may occur with  improper specimen collection/handling, submission of specimen other than nasopharyngeal swab, presence of viral mutation(s) within the areas targeted by this assay, and inadequate number of viral copies(<138 copies/mL). A negative result must be combined with clinical observations, patient history, and epidemiological information. The expected result is Negative.  Fact Sheet for Patients:  EntrepreneurPulse.com.au  Fact Sheet for Healthcare Providers:  IncredibleEmployment.be  This test is no t yet approved or cleared by the [place] FDA and  has been authorized for detection and/or diagnosis of SARS-CoV-2 by FDA under an Emergency Use Authorization (EUA). This EUA will remain  in effect (meaning this test can be used) for the duration of the COVID-19 declaration under Section 564(b)(1) of the Act, 21 U.S.C.section 360bbb-3(b)(1), unless the authorization is terminated  or revoked sooner.       Influenza A by PCR NEGATIVE NEGATIVE Final   Influenza B by PCR NEGATIVE NEGATIVE Final    Comment: (NOTE) The Xpert Xpress SARS-CoV-2/FLU/RSV plus assay is  intended as an aid in the diagnosis of influenza from Nasopharyngeal swab specimens and should not be used as a sole basis for treatment. Nasal washings and aspirates are unacceptable for Xpert Xpress SARS-CoV-2/FLU/RSV testing.  Fact Sheet for Patients: EntrepreneurPulse.com.au  Fact Sheet for Healthcare Providers: IncredibleEmployment.be  This test is not yet approved or cleared by the [place] FDA and has been authorized for detection and/or diagnosis of SARS-CoV-2 by FDA under an Emergency Use Authorization (EUA). This EUA will remain in effect (meaning this test can be used) for the duration of the COVID-19 declaration under Section 564(b)(1) of the Act, 21 U.S.C. section 360bbb-3(b)(1), unless the  authorization is terminated or revoked.  Performed at [facility], [address] [place], [place] [address]   Culture, blood (Routine X 2) w Reflex to ID Panel     Status: None (Preliminary result)   Collection Time: 03/06/21 11:29 PM   Specimen: BLOOD  Result Value Ref Range Status   Specimen Description BLOOD BLOOD RIGHT HAND  Final   Special Requests   Final    BOTTLES DRAWN AEROBIC AND ANAEROBIC Blood Culture adequate volume   Culture   Final    NO GROWTH < 12 HOURS Performed at [facility], [address], [place], [place] [address]    Report Status PENDING  Incomplete  Culture, blood (Routine X 2) w Reflex to ID Panel     Status: None (Preliminary result)   Collection Time: 03/06/21 11:29 PM   Specimen: BLOOD  Result Value Ref Range Status   Specimen Description BLOOD BLOOD LEFT HAND  Final   Special Requests IN PEDIATRIC BOTTLE Blood Culture adequate volume  Final   Culture   Final    NO GROWTH < 12 HOURS Performed at [facility], [address] [place], [place] [address]    Report Status PENDING  Incomplete         Radiology Studies: CT Head Wo Contrast  Result Date: 03/06/2021 CLINICAL DATA:  Found on floor, fall, pancreatic cancer EXAM: CT HEAD WITHOUT CONTRAST CT CERVICAL SPINE WITHOUT CONTRAST TECHNIQUE: Multidetector CT imaging of the head and cervical spine was performed following the standard protocol without intravenous contrast. Multiplanar CT image reconstructions of the cervical spine were also generated. COMPARISON:  None. FINDINGS: CT HEAD FINDINGS Brain: No evidence of acute infarction, hemorrhage, hydrocephalus, extra-axial collection or mass lesion/mass effect. Periventricular and deep white matter hypodensity 3 Vascular: No hyperdense vessel or unexpected calcification. Skull: Normal. Negative for fracture or focal lesion. Sinuses/Orbits: No acute finding. Other: None. CT CERVICAL SPINE FINDINGS Alignment: Normal.  Skull base and vertebrae: No acute fracture. No primary bone lesion or focal pathologic process. Soft tissues and spinal canal: No prevertebral fluid or swelling. No visible canal hematoma. Disc levels: Intact. Moderate multilevel bilateral facet degenerative change. Upper chest: Negative. Other: None. IMPRESSION: 1. No acute intracranial pathology. Small-vessel white matter disease. 2. No fracture or static subluxation of the cervical spine. Moderate multilevel bilateral facet degenerative change. Electronically Signed   By: Eddie Candle M.D.   On: 03/06/2021 12:40   CT Cervical Spine Wo Contrast  Result Date: 03/06/2021 CLINICAL DATA:  Found on floor, fall, pancreatic cancer EXAM: CT HEAD WITHOUT CONTRAST CT CERVICAL SPINE WITHOUT CONTRAST TECHNIQUE: Multidetector CT imaging of the head and cervical spine was performed following the standard protocol without intravenous contrast. Multiplanar CT image reconstructions of the cervical spine were also generated. COMPARISON:  None. FINDINGS: CT HEAD FINDINGS Brain: No evidence of  acute infarction, hemorrhage, hydrocephalus, extra-axial collection or mass lesion/mass effect. Periventricular and deep white matter hypodensity 3 Vascular: No hyperdense vessel or unexpected calcification. Skull: Normal. Negative for fracture or focal lesion. Sinuses/Orbits: No acute finding. Other: None. CT CERVICAL SPINE FINDINGS Alignment: Normal. Skull base and vertebrae: No acute fracture. No primary bone lesion or focal pathologic process. Soft tissues and spinal canal: No prevertebral fluid or swelling. No visible canal hematoma. Disc levels: Intact. Moderate multilevel bilateral facet degenerative change. Upper chest: Negative. Other: None. IMPRESSION: 1. No acute intracranial pathology. Small-vessel white matter disease. 2. No fracture or static subluxation of the cervical spine. Moderate multilevel bilateral facet degenerative change. Electronically Signed   By: Eddie Candle  M.D.   On: 03/06/2021 12:40   DG Chest Portable 1 View  Result Date: 03/06/2021 CLINICAL DATA:  70 year old male with pancreatic cancer, found down. Lethargy. EXAM: PORTABLE CHEST 1 VIEW COMPARISON:  CT chest 10/28/2020. FINDINGS: Portable AP upright view at 0853 hours. New right chest Port-A-Cath. Normal cardiac size and mediastinal contours. Visualized tracheal air column is within normal limits. Allowing for portable technique the lungs are clear. No pneumothorax. No acute osseous abnormality identified. Stable cholecystectomy clips. Paucity of bowel gas in the upper abdomen. IMPRESSION: Negative portable chest with right side Port-A-Cath in place. Electronically Signed   By: Genevie Ann M.D.   On: 03/06/2021 09:07   CT Angio Abd/Pel w/ and/or w/o  Result Date: 03/06/2021 CLINICAL DATA:  70 year old with suspected abdominal mass. History of pancreatic cancer. EXAM: CTA ABDOMEN AND PELVIS WITH CONTRAST TECHNIQUE: Multidetector CT imaging of the abdomen and pelvis was performed using the standard protocol during bolus administration of intravenous contrast. Multiplanar reconstructed images and MIPs were obtained and reviewed to evaluate the vascular anatomy. CONTRAST:  18mL OMNIPAQUE IOHEXOL 350 MG/ML SOLN COMPARISON:  CT report from 12/17/2020 from [place]. 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 Aortic stent below the SMA. Small amount of mural thrombus within the main aortic graft body but no significant stenosis. Abdominal aortic aneurysm roughly measures 2.5 x 2.6 cm on sequence 5 5, image 144. Limited evaluation for an endoleak due to the study technique. No evidence for aortic rupture. Celiac: Main celiac trunk is patent and small. Main branches coming off the main celiac trunk are the common hepatic artery and the splenic artery. The splenic artery has a very beaded configuration and could represent fibromuscular  dysplasia. The left gastric artery originates just proximal to the celiac trunk and there is a replaced left hepatic artery. SMA: SMA is patent with a replaced right hepatic artery. Renals: Left renal artery is widely patent without aneurysm, dissection or significant stenosis. Right renal artery is patent but difficult to exclude mild narrowing near the origin. In addition, there is a small accessory right renal artery supplying the lower pole that is likely excluded from the aortic stent graft with retrograde flow. IMA: Origin is occluded due to the aortic stent graft. There is reconstitution of IMA branches and limited evaluation for endoleak. Inflow: Bifurcated aortic stent graft extends into the bilateral iliac arteries. Left-sided stents terminate near the distal left common iliac artery. The distal left common iliac artery measures up to 2.3 cm without a significant aneurysm sac. Patient has stents involving the right external and right internal iliac artery. Bilateral internal iliac arteries are patent. There appears to be a saccular type aneurysm involving the left external iliac artery that roughly measures 1  cm on sequence 5, image 178. Atherosclerotic plaque and stenosis in the distal right external iliac artery. Proximal Outflow: Limited evaluation of the proximal outflow vessels due to timing of the examination. There appears to be bypass graft in the left groin. Limited evaluation of this left femoral bypass. Atherosclerotic plaque involving the right common femoral artery. Veins: Limited evaluation due to the phase of contrast. Review of the MIP images confirms the above findings. NON-VASCULAR Lower chest: Tiny nodules in the right lower lobe were probably present on the exam from 10/28/2020. Small amount of dependent atelectasis in the right lower lobe. No large pleural effusions. Underlying emphysematous changes in the lungs. Hepatobiliary: Large amount of perihepatic ascites. There is a metallic  biliary stent with pneumobilia. Limited evaluation for hepatic lesions to the phase of contrast but there is subtle low-density in left hepatic lobe near segment 2 and segment 4A that measures roughly 3.7 cm. Based on the previous [facility] report, patient had lesion in this area. Difficult to exclude interval enlargement based on these images. Pancreas: Limited evaluation due to the phase of contrast. However, there are at least 3 cystic areas near the pancreatic tail region that are most likely related to the known mass in this area. Largest cystic area measures up to 3.4 cm on sequence 12, image 6. The largest cystic area is probably abutting or involving the posterior gastric wall. Spleen: Spleen size is within normal limits. Adrenals/Urinary Tract: Limited evaluation of the adrenal glands. Atrophy or old infarct in the inferior right kidney related to the excluded right accessory renal artery. Stomach/Bowel: No significant dilatation of bowel structures. Very limited evaluation for bowel inflammation due to phase of contrast. Cystic lesion near the pancreatic tail appears to be involving or abutting the posterior wall the stomach. Lymphatic: Soft tissue fullness in the left periaortic region that could represent lymphadenopathy but poorly characterized. Abnormal stranding or soft tissue in the upper abdomen near the pancreas Reproductive: Limited evaluation of the prostate. Large fluid collection on the right side of scrotum could represent a large hydrocele. Other: Large amount of ascites in the abdomen and pelvis. Limited evaluation for peritoneal or omental disease on this examination. Musculoskeletal: No acute bone abnormality. IMPRESSION: VASCULAR 1. Bifurcated aortic stent graft is patent. Limited evaluation of the aneurysm sac and limited evaluation for an endoleak. 2. 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 Variant celiac artery anatomy as described. 3. Atrophy or  scarring involving the right kidney lower pole likely related to the excluded accessory right renal artery. 4. 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 NON-VASCULAR 1. Large amount of abdominal and pelvic ascites. This appears to be a new finding based on the previous report. 2. Limited evaluation of the patient's known pancreatic mass. Poorly visualized cystic lesions along the posterior wall the stomach and near the pancreatic tail likely represent the known pancreatic lesion. In addition there, is abnormal soft tissue near the celiac trunk and periaortic regions that could represent lymphadenopathy. Difficult to exclude omental thickening on this examination. 3. Metallic biliary stent with pneumobilia. 4. Probable low-density lesion in left hepatic lobe which corresponds with the previous report from [facility]. This area is poorly defined but may have enlarged since the previous examination. Findings are worrisome for progression of disease. 5. Probable right hydrocele. Electronically Signed   By: Markus Daft M.D.   On: 03/06/2021 13:09        Scheduled Meds: . lactulose  20 g Oral  Once   Continuous Infusions: . lactated ringers 50 mL/hr at 03/07/21 0530    Assessment & Plan:   Principal Problem:   Acute metabolic encephalopathy Active Problems:   Essential (primary) hypertension   AKI (acute kidney injury) ([place])   Elevated transaminase level   Hypomagnesemia   Elevated troponin   #) Acute metabolic encephalopathy: Likely due to pain medication and possibly due to hepatic etiology with elevated ammonia level.Marland Kitchen  His mental status is at baseline.  Reviewing his pain medication oxycodone and fentanyl per pharmacy has received multiple doses and refills.  I discussed this with the patient and his girlfriend at bedside. He did receive Narcan as above mentioned which helped. We will continue IV fluids and once his p.o. intake is better we will discontinue. Blood  cultures pending we will follow up continue lactulose as ammonia level was up too, continue to monitor.    #) Acute kidney injury: Likely prerenal Improvingwith IV fluids Creatinine 1.36 Hold nephrotoxic medications Hold HCTZ and lisinopril    #) Hypomagnesemia: stable after 19 sulfate IV given.  Magnesium 1.9 today     #) Elevated troponin: Asymptomatic without any chest pain.  Likely due to demand ischemia     #) Essential hypertension: Stable more on low side  Will hold lisinopril, HCTZ and Norvasc for now  Continue to monitor     #) Chronic tobacco abuse: Patient acknowledges that he is a current smoker, having smoked between half pack per day to three quarters of a pack per day over the last 40 years. Counseling was offered About smoking cessation  #) abdominal swelling-likely some ascitis. US guided paracentesis was ordered. Will f/u. Pt has seen Dr. Christel Mormon in the past. If need to , will consult, but will hold off for now.  #)h/xp pancreatic cancer- had chemo. [facility] . Family would like to know whether he will get more chemo here.  Consulted oncology to see pt.   DVT prophylaxis: scd Code Status:full Family Communication: family/girlfriend at bedside  Status is: Inpatient  Remains inpatient appropriate because:Inpatient level of care appropriate due to severity of illness   Dispo: The patient is from: Home              Anticipated d/c is to: Home              Patient currently is not medically stable to d/c.   Difficult to place patient No  Possible dc in am, if stable and oncology consulted.          LOS: 1 day   Time spent: 35 minutes with more than 50% on [place], MD [facility] Pager [phone]  If 7PM-7AM, please contact night-coverage 03/07/2021, 8:11 AM

## 2021-03-07 NOTE — Consult Note (Signed)
Clayton Gill NOTE  Patient Care Team: Gill, Clayton Jarvis, MD as PCP - General (Family Medicine)  CHIEF COMPLAINTS/PURPOSE OF CONSULTATION: Pancreatic cancer   HISTORY OF PRESENTING ILLNESS: Patient a poor historian/no family by the bedside. Clayton Gill 70 y.o.  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   Patient treated with modified FOLFIRINOX in February 4259 which was complicated by hospitalization for severe nausea vomiting abdominal pain.  However patient's cycle #2 FOLFOX was held because of hyperbilirubinemia.  Patient was suggested to have ERCP.  Patient is on narcotic pain medication for pain control.  Patient on the day of admission noted to have mental status changes for which EMS was called.  Patient mentation status post Narcan.  Patient appears drowsy but overall improved.  CT scan abdomen pelvis on admission shows -abdominal/pelvic ascites; vaguely visualized pancreatic lesion; omental thickening and liver lesion concerning for progressive disease.  Patient underwent paracentesis of about 700cc fluid.   Review of Systems  Unable to perform ROS: Other  Poor historian  MEDICAL HISTORY:  Past Medical History:  Diagnosis Date  . Chronic hepatitis C ([place])   . GERD (gastroesophageal reflux disease)   . High cholesterol   . Hypertension   . Rectal bleeding     SURGICAL HISTORY: Past Surgical History:  Procedure Laterality Date  . ABDOMINAL AORTIC ANEURYSM REPAIR    . CHOLECYSTECTOMY    . COLONOSCOPY    . COLONOSCOPY WITH PROPOFOL N/A 08/20/2016   Procedure: COLONOSCOPY WITH PROPOFOL;  Surgeon: Lollie Sails, MD;  Location: [facility] ENDOSCOPY;  Service: Endoscopy;  Laterality: N/A;  . COLONOSCOPY WITH PROPOFOL N/A 11/06/2019   Procedure: COLONOSCOPY WITH PROPOFOL;  Surgeon: Jonathon Bellows,  MD;  Location: [facility] ENDOSCOPY;  Service: Gastroenterology;  Laterality: N/A;  . HEMORRHOIDECTOMY WITH HEMORRHOID BANDING    . HEMORROIDECTOMY    . LOWER EXTREMITY ANGIOGRAPHY Right 12/11/2019   Procedure: LOWER EXTREMITY ANGIOGRAPHY;  Surgeon: Algernon Huxley, MD;  Location: [place] CV LAB;  Service: Cardiovascular;  Laterality: Right;  . LOWER EXTREMITY ANGIOGRAPHY Right 12/21/2019   Procedure: LOWER EXTREMITY ANGIOGRAPHY;  Surgeon: Algernon Huxley, MD;  Location: [place] CV LAB;  Service: Cardiovascular;  Laterality: Right;  . surgery left leg      SOCIAL HISTORY: Social History   Socioeconomic History  . Marital status: Single    Spouse name: Not on file  . Number of children: Not on file  . Years of education: Not on file  . Highest education level: Not on file  Occupational History  . Not on file  Tobacco Use  . Smoking status: Current Some Day Smoker    Packs/day: 0.75    Years: 40.00    Pack years: 30.00    Types: Cigarettes  . Smokeless tobacco: Never Used  Vaping Use  . Vaping Use: Never used  Substance and Sexual Activity  . Alcohol use: No  . Drug use: No  . Sexual activity: Not on file  Other Topics Concern  . Not on file  Social History Narrative   ** Merged History Encounter **       Social Determinants of Health   Financial Resource Strain: Not on file  Food Insecurity: Not on file  Transportation Needs: Not on file  Physical Activity: Not on file  Stress: Not on file  Social Connections: Not on file  Intimate Partner Violence: Not on file    FAMILY HISTORY: Family History  Problem Relation Age of Onset  . Hypertension Mother   . Diabetes Mother     ALLERGIES:  has No Known Allergies.  MEDICATIONS:  Current Facility-Administered Medications  Medication Dose Route Frequency Provider Last Rate Last Admin  . acetaminophen (TYLENOL) tablet 650 mg  650 mg Oral Q6H PRN Howerter, Justin B, DO       Or  . acetaminophen (TYLENOL) suppository  650 mg  650 mg Rectal Q6H PRN Howerter, Justin B, DO      . Chlorhexidine Gluconate Cloth 2 % PADS 6 each  6 each Topical Daily Nolberto Hanlon, MD      . feeding supplement (BOOST / RESOURCE BREEZE) liquid 1 Container  1 Container Oral TID BM Nolberto Hanlon, MD   1 Container at 03/07/21 2155  . lactulose (CHRONULAC) 10 GM/15ML solution 20 g  20 g Oral Once Howerter, Justin B, DO      . naloxone [facility]) injection 0.4 mg  0.4 mg Intravenous PRN Howerter, Justin B, DO   0.4 mg at 03/06/21 2343  . nicotine (NICODERM CQ - dosed in mg/24 hours) patch 14 mg  14 mg Transdermal Daily PRN Howerter, Justin B, DO          .  PHYSICAL EXAMINATION:  Vitals:   03/07/21 1546 03/07/21 2043  BP: 111/74 126/90  Pulse: 78 75  Resp: 16 18  Temp: 98.5 F (36.9 C) 98.7 F (37.1 C)  SpO2: 100% 100%   Filed Weights   03/06/21 0846 03/07/21 0417  Weight: 130 lb (59 kg) 124 lb 9 oz (56.5 kg)    Physical Exam Constitutional:      Comments: Cachectic appearing Caucasian male patient.  He is resting in the bed.  Temporal wasting noted.  No acute distress.  HENT:     Head: Normocephalic and atraumatic.     Mouth/Throat:     Pharynx: No oropharyngeal exudate.  Eyes:     Pupils: Pupils are equal, round, and reactive to light.  Cardiovascular:     Rate and Rhythm: Normal rate and regular rhythm.  Pulmonary:     Effort: No respiratory distress.     Breath sounds: No wheezing.     Comments: Decreased air entry bilaterally. Abdominal:     General: Bowel sounds are normal. There is no distension.     Palpations: Abdomen is soft. There is no mass.     Tenderness: There is no abdominal tenderness. There is no guarding or rebound.     Comments: Abdominal distention noted.  Musculoskeletal:        General: No tenderness. Normal range of motion.     Cervical back: Normal range of motion and neck supple.  Skin:    General: Skin is warm.  Neurological:     Mental Status: He is alert and oriented to person,  place, and time.  Psychiatric:        Mood and Affect: Affect normal.      LABORATORY DATA:  I have reviewed the data as listed Lab Results  Component Value Date   WBC 5.4 03/07/2021   HGB 10.9 (L) 03/07/2021   HCT 32.1 (L) 03/07/2021   MCV 90.9 03/07/2021   PLT 101 (L) 03/07/2021   Recent Labs    04/30/20 1340 03/06/21 0855 03/07/21 0522  NA 139 140 139  K 3.5 4.2 4.2  CL 98 107 107  CO2 25 25 25  GLUCOSE 88 72 84  BUN 12 41* 53*  CREATININE 1.12 1.42* 1.36*  CALCIUM 9.7 8.7* 8.8*  GFRNONAA 68 54* 57*  GFRAA 78  --   --   PROT 7.8 6.8 6.4*  ALBUMIN 4.3 2.8* 2.7*  AST 20 59* 59*  ALT 14 97* 94*  ALKPHOS 70 513* 486*  BILITOT 0.4 2.9* 2.6*  BILIDIR  --   --  1.2*    RADIOGRAPHIC STUDIES: I have personally reviewed the radiological images as listed and agreed with the findings in the report. CT Head Wo Contrast  Result Date: 03/06/2021 CLINICAL DATA:  Found on floor, fall, pancreatic cancer EXAM: CT HEAD WITHOUT CONTRAST CT CERVICAL SPINE WITHOUT CONTRAST TECHNIQUE: Multidetector CT imaging of the head and cervical spine was performed following the standard protocol without intravenous contrast. Multiplanar CT image reconstructions of the cervical spine were also generated. COMPARISON:  None. FINDINGS: CT HEAD FINDINGS Brain: No evidence of acute infarction, hemorrhage, hydrocephalus, extra-axial collection or mass lesion/mass effect. Periventricular and deep white matter hypodensity 3 Vascular: No hyperdense vessel or unexpected calcification. Skull: Normal. Negative for fracture or focal lesion. Sinuses/Orbits: No acute finding. Other: None. CT CERVICAL SPINE FINDINGS Alignment: Normal. Skull base and vertebrae: No acute fracture. No primary bone lesion or focal pathologic process. Soft tissues and spinal canal: No prevertebral fluid or swelling. No visible canal hematoma. Disc levels: Intact. Moderate multilevel bilateral facet degenerative change. Upper chest: Negative.  Other: None. IMPRESSION: 1. No acute intracranial pathology. Small-vessel white matter disease. 2. No fracture or static subluxation of the cervical spine. Moderate multilevel bilateral facet degenerative change. Electronically Signed   By: Eddie Candle M.D.   On: 03/06/2021 12:40   CT Cervical Spine Wo Contrast  Result Date: 03/06/2021 CLINICAL DATA:  Found on floor, fall, pancreatic cancer EXAM: CT HEAD WITHOUT CONTRAST CT CERVICAL SPINE WITHOUT CONTRAST TECHNIQUE: Multidetector CT imaging of the head and cervical spine was performed following the standard protocol without intravenous contrast. Multiplanar CT image reconstructions of the cervical spine were also generated. COMPARISON:  None. FINDINGS: CT HEAD FINDINGS Brain: No evidence of acute infarction, hemorrhage, hydrocephalus, extra-axial collection or mass lesion/mass effect. Periventricular and deep white matter hypodensity 3 Vascular: No hyperdense vessel or unexpected calcification. Skull: Normal. Negative for fracture or focal lesion. Sinuses/Orbits: No acute finding. Other: None. CT CERVICAL SPINE FINDINGS Alignment: Normal. Skull base and vertebrae: No acute fracture. No primary bone lesion or focal pathologic process. Soft tissues and spinal canal: No prevertebral fluid or swelling. No visible canal hematoma. Disc levels: Intact. Moderate multilevel bilateral facet degenerative change. Upper chest: Negative. Other: None. IMPRESSION: 1. No acute intracranial pathology. Small-vessel white matter disease. 2. No fracture or static subluxation of the cervical spine. Moderate multilevel bilateral facet degenerative change. Electronically Signed   By: Eddie Candle M.D.   On: 03/06/2021 12:40   US Paracentesis  Result Date: 03/07/2021 INDICATION: Patient with recently diagnosed pancreatic cancer presents today for a diagnostic and therapeutic paracentesis. EXAM: ULTRASOUND GUIDED PARACENTESIS MEDICATIONS: 1% lidocaine 10 mL COMPLICATIONS: None  immediate. PROCEDURE: Informed written consent was obtained from the patient after a discussion of the risks, benefits and alternatives to treatment. A timeout was performed prior to the initiation of the procedure. Initial ultrasound scanning demonstrates a large amount of ascites within the right lower abdominal quadrant. The right lower abdomen was prepped and draped in the usual sterile fashion. 1% lidocaine was used for local anesthesia. Following this, a 6 Fr Safe-T-Centesis catheter was introduced. An  ultrasound image was saved for documentation purposes. The paracentesis was performed. The catheter was removed and a dressing was applied. The patient tolerated the procedure well without immediate post procedural complication. FINDINGS: A total of approximately 700 mL of clear yellow fluid was removed. Samples were sent to the laboratory as requested by the clinical team. IMPRESSION: Successful ultrasound-guided paracentesis yielding 700 mL of peritoneal fluid. Read by: Soyla Dryer, NP Electronically Signed   By: Aletta Edouard M.D.   On: 03/07/2021 14:50   DG Chest Portable 1 View  Result Date: 03/06/2021 CLINICAL DATA:  70 year old male with pancreatic cancer, found down. Lethargy. EXAM: PORTABLE CHEST 1 VIEW COMPARISON:  CT chest 10/28/2020. FINDINGS: Portable AP upright view at 0853 hours. New right chest Port-A-Cath. Normal cardiac size and mediastinal contours. Visualized tracheal air column is within normal limits. Allowing for portable technique the lungs are clear. No pneumothorax. No acute osseous abnormality identified. Stable cholecystectomy clips. Paucity of bowel gas in the upper abdomen. IMPRESSION: Negative portable chest with right side Port-A-Cath in place. Electronically Signed   By: Genevie Ann M.D.   On: 03/06/2021 09:07   CT Angio Abd/Pel w/ and/or w/o  Result Date: 03/06/2021 CLINICAL DATA:  70 year old with suspected abdominal mass. History of pancreatic cancer. EXAM: CTA  ABDOMEN AND PELVIS WITH CONTRAST TECHNIQUE: Multidetector CT imaging of the abdomen and pelvis was performed using the standard protocol during bolus administration of intravenous contrast. Multiplanar reconstructed images and MIPs were obtained and reviewed to evaluate the vascular anatomy. CONTRAST:  58mL OMNIPAQUE IOHEXOL 350 MG/ML SOLN COMPARISON:  CT report from 12/17/2020 from [facility]. 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 Aortic stent below the SMA. Small amount of mural thrombus within the main aortic graft body but no significant stenosis. Abdominal aortic aneurysm roughly measures 2.5 x 2.6 cm on sequence 5 5, image 144. Limited evaluation for an endoleak due to the study technique. No evidence for aortic rupture. Celiac: Main celiac trunk is patent and small. Main branches coming off the main celiac trunk are the common hepatic artery and the splenic artery. The splenic artery has a very beaded configuration and could represent fibromuscular dysplasia. The left gastric artery originates just proximal to the celiac trunk and there is a replaced left hepatic artery. SMA: SMA is patent with a replaced right hepatic artery. Renals: Left renal artery is widely patent without aneurysm, dissection or significant stenosis. Right renal artery is patent but difficult to exclude mild narrowing near the origin. In addition, there is a small accessory right renal artery supplying the lower pole that is likely excluded from the aortic stent graft with retrograde flow. IMA: Origin is occluded due to the aortic stent graft. There is reconstitution of IMA branches and limited evaluation for endoleak. Inflow: Bifurcated aortic stent graft extends into the bilateral iliac arteries. Left-sided stents terminate near the distal left common iliac artery. The distal left common iliac artery measures up to 2.3 cm without a significant aneurysm  sac. Patient has stents involving the right external and right internal iliac artery. Bilateral internal iliac arteries are patent. There appears to be a saccular type aneurysm involving the left external iliac artery that roughly measures 1 cm on sequence 5, image 178. Atherosclerotic plaque and stenosis in the distal right external iliac artery. Proximal Outflow: Limited evaluation of the proximal outflow vessels due to timing of the examination. There appears to be bypass graft in the left groin.  Limited evaluation of this left femoral bypass. Atherosclerotic plaque involving the right common femoral artery. Veins: Limited evaluation due to the phase of contrast. Review of the MIP images confirms the above findings. NON-VASCULAR Lower chest: Tiny nodules in the right lower lobe were probably present on the exam from 10/28/2020. Small amount of dependent atelectasis in the right lower lobe. No large pleural effusions. Underlying emphysematous changes in the lungs. Hepatobiliary: Large amount of perihepatic ascites. There is a metallic biliary stent with pneumobilia. Limited evaluation for hepatic lesions to the phase of contrast but there is subtle low-density in left hepatic lobe near segment 2 and segment 4A that measures roughly 3.7 cm. Based on the previous [facility] report, patient had lesion in this area. Difficult to exclude interval enlargement based on these images. Pancreas: Limited evaluation due to the phase of contrast. However, there are at least 3 cystic areas near the pancreatic tail region that are most likely related to the known mass in this area. Largest cystic area measures up to 3.4 cm on sequence 12, image 6. The largest cystic area is probably abutting or involving the posterior gastric wall. Spleen: Spleen size is within normal limits. Adrenals/Urinary Tract: Limited evaluation of the adrenal glands. Atrophy or old infarct in the inferior right kidney related to the excluded right accessory  renal artery. Stomach/Bowel: No significant dilatation of bowel structures. Very limited evaluation for bowel inflammation due to phase of contrast. Cystic lesion near the pancreatic tail appears to be involving or abutting the posterior wall the stomach. Lymphatic: Soft tissue fullness in the left periaortic region that could represent lymphadenopathy but poorly characterized. Abnormal stranding or soft tissue in the upper abdomen near the pancreas Reproductive: Limited evaluation of the prostate. Large fluid collection on the right side of scrotum could represent a large hydrocele. Other: Large amount of ascites in the abdomen and pelvis. Limited evaluation for peritoneal or omental disease on this examination. Musculoskeletal: No acute bone abnormality. IMPRESSION: VASCULAR 1. Bifurcated aortic stent graft is patent. Limited evaluation of the aneurysm sac and limited evaluation for an endoleak. 2. 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 Variant celiac artery anatomy as described. 3. Atrophy or scarring involving the right kidney lower pole likely related to the excluded accessory right renal artery. 4. 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 NON-VASCULAR 1. Large amount of abdominal and pelvic ascites. This appears to be a new finding based on the previous report. 2. Limited evaluation of the patient's known pancreatic mass. Poorly visualized cystic lesions along the posterior wall the stomach and near the pancreatic tail likely represent the known pancreatic lesion. In addition there, is abnormal soft tissue near the celiac trunk and periaortic regions that could represent lymphadenopathy. Difficult to exclude omental thickening on this examination. 3. Metallic biliary stent with pneumobilia. 4. Probable low-density lesion in left hepatic lobe which corresponds with the previous report from [facility]. This area is poorly defined but may have enlarged  since the previous examination. Findings are worrisome for progression of disease. 5. Probable right hydrocele. Electronically Signed   By: Markus Daft M.D.   On: 03/06/2021 13:09    Carcinoma of pancreas metastatic to liver [facility]) #70 year old male patient with metastatic adenocarcinoma of the liver is currently admitted to hospital for mental status changes  #Acute mental status changes-likely secondary to narcotic pain medication.  Status post Narcan currently improved; not baseline yet.  Low clinical suspicion for metastatic disease to the brain.  #  Metastatic pancreatic adenocarcinoma metastatic liver currently on FOLFOX interrupted by hyperbilirubinemia; s/p ERCP. CT scan concerning for progressive disease.  #Abdominal distention-ascites likely malignant s/p paracentesis.  Recommendation:  #Await resolution of mental status changes/back to baseline.  Hold inpatient chemotherapy given acute issues.  I would recommend patient follow-up with [facility] hematology oncology for consideration of subsequent chemotherapy/or referral to palliative care/hospice given patient's borderline performance status.   #Discussed my above concerns with the patient's uncle, Eddie Dibbles who is concerned about patient going back to home to live by himself.  Recommend physical therapy evaluation for disposition.  Thank you Dr.Amery for allowing me to participate in the care of your pleasant patient. Please do not hesitate to contact me with questions or concerns in the interim.  All questions were answered. The patient knows to call the clinic with any problems, questions or concerns.   Cammie Sickle, MD 03/07/2021 11:06 PM

## 2021-03-07 NOTE — NC FL2 (Signed)
[place] LEVEL OF CARE SCREENING TOOL     IDENTIFICATION  Patient Name: Clayton Gill Birthdate: 19-Oct-1951 Sex: male Admission Date (Current Location): 03/06/2021  [place] and [place] Number:  Engineering geologist and Address:  [facility], [address], [place], [place] [address]      Provider Number: [id number]  Attending Physician Name and Address:  Nolberto Hanlon, MD  Relative Name and Phone Number:  Waldron Session [phone]    Current Level of Care: Hospital Recommended Level of Care: [place] Prior Approval Number:    Date Approved/Denied:   PASRR Number: [id number]  Discharge Plan: SNF    Current Diagnoses: Patient Active Problem List   Diagnosis Date Noted  . Acute metabolic encephalopathy 84/69/6295  . AKI (acute kidney injury) ([place]) 03/06/2021  . Elevated transaminase level 03/06/2021  . Hypomagnesemia 03/06/2021  . Elevated troponin 03/06/2021  . Neuropathic pain of left lower extremity 11/10/2019  . Chronic viral hepatitis C ([place]) 03/16/2016  . Mixed hyperlipidemia 03/16/2016  . Mononeuropathy of left lower extremity 03/16/2016  . Encounter for screening for malignant neoplasm of colon 01/27/2016  . Other injury of unspecified body region 06/03/2015  . Abdominal aortic aneurysm without rupture ([place]) 05/14/2015  . Atherosclerosis of native arteries of extremity with rest pain ([place]) 05/14/2015  . Abnormal results of liver function studies 01/24/2015  . Essential (primary) hypertension 01/22/2015    Orientation RESPIRATION BLADDER Height & Weight     Self,Time,Situation,Place  Normal Continent Weight: 56.5 kg Height:  6\' 1"  (185.4 cm)  BEHAVIORAL SYMPTOMS/MOOD NEUROLOGICAL BOWEL NUTRITION STATUS      Continent Diet (regular)  AMBULATORY STATUS COMMUNICATION OF NEEDS Skin   Extensive Assist Verbally Normal                       Personal Care Assistance Level of Assistance   Bathing,Dressing Bathing Assistance: Maximum assistance   Dressing Assistance: Maximum assistance     Functional Limitations Info             SPECIAL CARE FACTORS FREQUENCY  PT (By licensed PT),OT (By licensed OT)     PT Frequency: 5 times per week OT Frequency: 3 times per week            Contractures Contractures Info: Not present    Additional Factors Info  Allergies,Code Status Code Status Info: full Allergies Info: NKDA           Current Medications (03/07/2021):  This is the current hospital active medication list Current Facility-Administered Medications  Medication Dose Route Frequency Provider Last Rate Last Admin  . acetaminophen (TYLENOL) tablet 650 mg  650 mg Oral Q6H PRN Howerter, Justin B, DO       Or  . acetaminophen (TYLENOL) suppository 650 mg  650 mg Rectal Q6H PRN Howerter, Justin B, DO      . feeding supplement (BOOST / RESOURCE BREEZE) liquid 1 Container  1 Container Oral TID BM Nolberto Hanlon, MD   1 Container at 03/07/21 1409  . lactated ringers infusion   Intravenous Continuous Athena Masse, MD 50 mL/hr at 03/07/21 1138 New Bag at 03/07/21 1138  . lactulose (CHRONULAC) 10 GM/15ML solution 20 g  20 g Oral Once Howerter, Justin B, DO      . naloxone [facility]) injection 0.4 mg  0.4 mg Intravenous PRN Howerter, Justin B, DO   0.4 mg at 03/06/21 2343  . nicotine (NICODERM CQ - dosed in mg/24  hours) patch 14 mg  14 mg Transdermal Daily PRN Howerter, Justin B, DO         Discharge Medications: Please see discharge summary for a list of discharge medications.  Relevant Imaging Results:  Relevant Lab Results:   Additional Information SS# [id number]  Su Hilt, RN

## 2021-03-08 DIAGNOSIS — C259 Malignant neoplasm of pancreas, unspecified: Secondary | ICD-10-CM

## 2021-03-08 DIAGNOSIS — E43 Unspecified severe protein-calorie malnutrition: Secondary | ICD-10-CM

## 2021-03-08 DIAGNOSIS — C787 Secondary malignant neoplasm of liver and intrahepatic bile duct: Secondary | ICD-10-CM

## 2021-03-08 LAB — BASIC METABOLIC PANEL
Anion gap: 9 (ref 5–15)
BUN: 45 mg/dL — ABNORMAL HIGH (ref 8–23)
CO2: 22 mmol/L (ref 22–32)
Calcium: 8.3 mg/dL — ABNORMAL LOW (ref 8.9–10.3)
Chloride: 104 mmol/L (ref 98–111)
Creatinine, Ser: 1.19 mg/dL (ref 0.61–1.24)
GFR, Estimated: >60 mL/min (ref >60)
Glucose, Bld: 103 mg/dL — ABNORMAL HIGH (ref 70–99)
Potassium: 3.7 mmol/L (ref 3.5–5.1)
Sodium: 135 mmol/L (ref 135–145)

## 2021-03-08 LAB — CBC
HCT: 33 % — ABNORMAL LOW (ref 39.0–52.0)
Hemoglobin: 11.2 g/dL — ABNORMAL LOW (ref 13.0–17.0)
MCH: 30.7 pg (ref 26.0–34.0)
MCHC: 33.9 g/dL (ref 30.0–36.0)
MCV: 90.4 fL (ref 80.0–100.0)
Platelets: 88 10*3/uL — ABNORMAL LOW (ref 150–400)
RBC: 3.65 MIL/uL — ABNORMAL LOW (ref 4.22–5.81)
RDW: 15.6 % — ABNORMAL HIGH (ref 11.5–15.5)
WBC: 5.4 10*3/uL (ref 4.0–10.5)
nRBC: 0 % (ref 0.0–0.2)

## 2021-03-08 LAB — AMMONIA: Ammonia: 110 umol/L — ABNORMAL HIGH (ref 9–35)

## 2021-03-08 LAB — PROTEIN, BODY FLUID (OTHER): Total Protein, Body Fluid Other: 1.1 g/dL

## 2021-03-08 MED ORDER — OXYCODONE HCL 5 MG PO TABS
5.0000 mg | ORAL_TABLET | ORAL | Status: AC | PRN
Start: 2021-03-08 — End: 2021-03-08
  Administered 2021-03-08 (×2): 5 mg via ORAL
  Filled 2021-03-08 (×2): qty 1

## 2021-03-08 MED ORDER — SODIUM CHLORIDE 0.9 % IV SOLN: 8.0000 mg | Freq: Three times a day (TID) | INTRAVENOUS | Status: DC | PRN

## 2021-03-08 MED ORDER — HYDROMORPHONE HCL 1 MG/ML IJ SOLN
1.0000 mg | INTRAMUSCULAR | Status: DC | PRN
Start: 2021-03-08 — End: 2021-03-12

## 2021-03-08 MED ORDER — MORPHINE SULFATE (PF) 2 MG/ML IV SOLN
1.0000 mg | INTRAVENOUS | Status: DC | PRN
  Administered 2021-03-08: 11:00:00 1 mg via INTRAVENOUS
  Filled 2021-03-08: qty 1

## 2021-03-08 MED ORDER — OXYCODONE HCL 5 MG PO TABS
20.0000 mg | ORAL_TABLET | Freq: Once | ORAL | Status: AC
  Administered 2021-03-08: 20 mg via ORAL
  Filled 2021-03-08: qty 4

## 2021-03-08 MED ORDER — OXYCODONE-ACETAMINOPHEN 7.5-325 MG PO TABS
1.0000 | ORAL_TABLET | ORAL | Status: DC | PRN
Start: 2021-03-08 — End: 2021-03-08

## 2021-03-08 MED ORDER — OXYCODONE-ACETAMINOPHEN 5-325 MG PO TABS
2.0000 | ORAL_TABLET | ORAL | Status: DC | PRN
  Administered 2021-03-08 – 2021-03-09 (×2): 2 via ORAL
  Filled 2021-03-08 (×2): qty 2

## 2021-03-08 NOTE — Evaluation (Signed)
Occupational Therapy Evaluation Patient Details Name: Clayton Gill MRN: [id number] DOB: August 10, 1951 Today's Date: 03/08/2021    History of Present Illness Pt is a 70 y.o. male with medical history significant for recently diagnosed pancreatic cancer on chemotherapy, biliary obstruction status post placement of biliary stent, hypertension, hyperlipidemia, chronic hepatitis C, AAA status post repair, who is admitted to [facility] on 03/06/2021 with acute metabolic encephalopathy after presenting from home to [facility] ED for evaluation of altered mental status. MD assessment includes: Acute metabolic encephalopathy, AKI, Hypomagnesemia, Elevated troponin (asymptomatic without any chest pain, likely due to demand ischemia), abdominal swelling likely with US guided paracentesis ordered, and HTN.   Clinical Impression   Pt referred to Occupational Therapy for evaluation.  Pt in bed on arrival, has covers over his head, lethargic at times, reports pain in abdomen 6/10.  Pt speaks softly and kept putting his head under the covers therefore it was difficult at times to understand patient.  Pt reports he lives alone and was independent prior to admission.  Pt now presents with muscle weakness, decreased transfers, bed mobility and functional mobility skills, decreased ADL and IADL tasks and would benefit from skilled OT services to maximize safety and independence in necessary daily tasks.  Pt will likely benefit from STR at SNF prior to return home to improve skills and independence since he lives alone.      Follow Up Recommendations       Equipment Recommendations       Recommendations for Other Services       Precautions / Restrictions Precautions Precautions: Fall      Mobility Bed Mobility Overal bed mobility: Needs Assistance Bed Mobility: Rolling;Sidelying to Sit;Sit to Sidelying Rolling: Min assist Sidelying to sit: Min assist     Sit to sidelying: Min assist       Transfers Overall transfer level: Needs assistance Equipment used: Rolling walker (2 wheeled)                  Balance Overall balance assessment: Needs assistance   Sitting balance-Leahy Scale: Good                                     ADL either performed or assessed with clinical judgement   ADL Overall ADL's : Needs assistance/impaired Eating/Feeding: Modified independent   Grooming: Set up   Upper Body Bathing: Set up   Lower Body Bathing: Set up;Minimal assistance   Upper Body Dressing : Set up   Lower Body Dressing: Set up;Minimal assistance   Toilet Transfer: Minimal assistance           Functional mobility during ADLs: Minimal assistance       Vision Baseline Vision/History: Wears glasses Wears Glasses: At all times       Perception     Praxis      Pertinent Vitals/Pain Pain Score: 6  Pain Location: abdominal pain Pain Descriptors / Indicators: Aching     Hand Dominance Right   Extremity/Trunk Assessment Upper Extremity Assessment Upper Extremity Assessment: Generalized weakness   Lower Extremity Assessment Lower Extremity Assessment: Defer to PT evaluation       Communication Communication Communication: Other (comment) (Pt soft spoken and kept putting his head under the covers was difficult to understand at times)   Cognition Arousal/Alertness: Lethargic Behavior During Therapy: WFL for tasks assessed/performed Overall Cognitive Status: Within Functional Limits for tasks assessed  General Comments: Pt in bed on arrival, placed his head under the sheet when answering questions, had to ask pt to repeat info often and to pull covers down off his head   General Comments       Exercises     Shoulder Instructions      Home Living Family/patient expects to be discharged to:: Private residence Living Arrangements: Alone Available Help at Discharge:  Friend(s);Available PRN/intermittently Type of Home: Apartment Home Access: Level entry     Home Layout: One level     Bathroom Shower/Tub: Teacher, early years/pre: Standard     Home Equipment: Shower seat;Cane - single point;Grab bars - toilet;Grab bars - tub/shower          Prior Functioning/Environment Level of Independence: Independent        Comments: Ind amb community distances, no fall history, Independent with ADLs        OT Problem List:        OT Treatment/Interventions:      OT Goals(Current goals can be found in the care plan section) Acute Rehab OT Goals Patient Stated Goal: To get stronger, go home OT Goal Formulation: With patient Time For Goal Achievement: 03/22/21 Potential to Achieve Goals: Good ADL Goals Pt Will Perform Lower Body Dressing: (P) with modified independence Pt Will Transfer to Toilet: (P) with modified independence Additional ADL Goal #1: (P) Pt will improve bilateral UE strength by 1 mm grade to assist with completion of self care tasks  OT Frequency:     Barriers to D/C:            Co-evaluation              AM-PAC OT "6 Clicks" Daily Activity     Outcome Measure                 End of Session    Activity Tolerance:   Patient left:                     Time: 1020-1030 OT Time Calculation (min): 10 min Charges:  OT General Charges $OT Visit: 1 Visit OT Evaluation $OT Eval Low Complexity: 1 Low  [name] T [name], OTR/L, CLT   [name] 03/08/2021, 12:47 PM

## 2021-03-08 NOTE — Progress Notes (Signed)
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  AKI: continue on IVFs. Continue to hold lisinopril, HCTZ. Cr is trending down from day prior   Hypomagnesemia: WNL  Elevated troponin: likely secondary to demand ischemia   HTN: continue on amlodipine. Continue to hold lisinopril, HCTZ   Chronic tobacco abuse: smoking cessation counseling   Ascitis.s/p US paracentesis w/ 787mL of peritoneal fluid removed   Hx of pancreatic cancer: stage IV w/ liver mets. Recently had chemo. CT scan concern for progressive disease. Oxycodone, morphine prn pain   Thrombocytopenia: likely secondary to recent chemo. Will continue to monitor  Transaminitis: likely secondary to liver mets. Will continue to monitor    Likely severe protein calorie malnutrition: will consult nutrition    DVT prophylaxis: SCDs Code Status: full  Family Communication:  Disposition Plan: SNF vs HH   Level of care: Med-Surg   Status is: Inpatient  Remains inpatient appropriate because:Unsafe d/c plan, IV treatments appropriate due to intensity of illness or inability to take PO and Inpatient level of care appropriate due to severity of illness   Dispo: The patient is from: Home              Anticipated d/c is to: SNF              Patient currently is not medically stable to d/c.   Difficult to place patient Yes    Consultants:   onco   Procedures:    Antimicrobials:     Subjective: Pt c/o abd pain   Objective: Vitals:   03/07/21 2043 03/07/21 2330 03/08/21 0418 03/08/21 0722  BP: 126/90 116/80 (!) 133/94 111/76  Pulse: 75 88 90 90  Resp: 18 15 16 17   Temp: 98.7 F (37.1 C) 98.4 F (36.9 C) 98.4 F (36.9 C) 98.6 F (37 C)  TempSrc: Oral Oral Oral Oral  SpO2: 100% 100% 100% 100%  Weight:      Height:        Intake/Output Summary (Last 24 hours) at 03/08/2021 1109 Last data filed at 03/08/2021 1017 Gross per 24 hour  Intake 1240.99 ml  Output 500 ml  Net 740.99 ml   Filed Weights   03/06/21 0846 03/07/21 0417  Weight: 59 kg 56.5 kg    Examination:  General exam: Appears calm but uncomfortable.  Respiratory system: Clear to auscultation. Respiratory effort normal. Cardiovascular system: S1 & S2 +. No rubs, gallops or clicks. Gastrointestinal system: Abdomen is nondistended, soft and tenderness to palpation. Normal bowel sounds heard. Central nervous system: Alert and oriented to person, place. Moves all 4 extremities  Psychiatry: Judgement and insight appear normal. Flat mood and affect.     Data Reviewed: I have personally reviewed following labs and imaging studies  CBC: Recent Labs  Lab 03/06/21 0855 03/07/21 0522 03/08/21 0758  WBC 4.6 5.4 5.4  NEUTROABS 3.3  --   --   HGB 10.9* 10.9* 11.2*  HCT 32.3* 32.1* 33.0*  MCV 92.6 90.9 90.4  PLT 100* 101* 88*  Basic Metabolic Panel: Recent Labs  Lab 03/06/21 0855 03/07/21 0522 03/08/21 0758  NA 140 139 135  K 4.2 4.2 3.7  CL 107 107 104  CO2 25 25 22   GLUCOSE 72 84 103*  BUN 41* 53* 45*  CREATININE 1.42* 1.36* 1.19  CALCIUM 8.7* 8.8* 8.3*  MG 1.6* 1.9  --    GFR: Estimated Creatinine Clearance: 47.5 mL/min (by C-G formula based on SCr of 1.19 mg/dL). Liver Function Tests: Recent Labs  Lab 03/06/21 0855 03/07/21 0522  AST 59* 59*  ALT 97* 94*  ALKPHOS 513* 486*  BILITOT 2.9* 2.6*  PROT 6.8 6.4*  ALBUMIN 2.8* 2.7*   Recent Labs  Lab 03/06/21 0855   LIPASE 25   Recent Labs  Lab 03/06/21 0854 03/08/21 0758  AMMONIA 112* 110*   Coagulation Profile: Recent Labs  Lab 03/06/21 0855 03/07/21 0522  INR 1.4* 1.6*   Cardiac Enzymes: Recent Labs  Lab 03/06/21 0855  CKTOTAL 129   BNP (last 3 results) No results for input(s): PROBNP in the last 8760 hours. HbA1C: No results for input(s): HGBA1C in the last 72 hours. CBG: Recent Labs  Lab 03/06/21 2228  GLUCAP 133*   Lipid Profile: No results for input(s): CHOL, HDL, LDLCALC, TRIG, CHOLHDL, LDLDIRECT in the last 72 hours. Thyroid Function Tests: Recent Labs    03/07/21 0522  TSH 2.696   Anemia Panel: No results for input(s): VITAMINB12, FOLATE, FERRITIN, TIBC, IRON, RETICCTPCT in the last 72 hours. Sepsis Labs: No results for input(s): PROCALCITON, LATICACIDVEN in the last 168 hours.  Recent Results (from the past 240 hour(s))  Resp Panel by RT-PCR (Flu A&B, Covid) Nasopharyngeal Swab     Status: None   Collection Time: 03/06/21  8:48 AM   Specimen: Nasopharyngeal Swab; Nasopharyngeal(NP) swabs in vial transport medium  Result Value Ref Range Status   SARS Coronavirus 2 by RT PCR NEGATIVE NEGATIVE Final    Comment: (NOTE) SARS-CoV-2 target nucleic acids are NOT DETECTED.  The SARS-CoV-2 RNA is generally detectable in upper respiratory specimens during the acute phase of infection. The lowest concentration of SARS-CoV-2 viral copies this assay can detect is 138 copies/mL. A negative result does not preclude SARS-Cov-2 infection and should not be used as the sole basis for treatment or other patient management decisions. A negative result may occur with  improper specimen collection/handling, submission of specimen other than nasopharyngeal swab, presence of viral mutation(s) within the areas targeted by this assay, and inadequate number of viral copies(<138 copies/mL). A negative result must be combined with clinical observations, patient history, and  epidemiological information. The expected result is Negative.  Fact Sheet for Patients:  EntrepreneurPulse.com.au  Fact Sheet for Healthcare Providers:  IncredibleEmployment.be  This test is no t yet approved or cleared by the [place] FDA and  has been authorized for detection and/or diagnosis of SARS-CoV-2 by FDA under an Emergency Use Authorization (EUA). This EUA will remain  in effect (meaning this test can be used) for the duration of the COVID-19 declaration under Section 564(b)(1) of the Act, 21 U.S.C.section 360bbb-3(b)(1), unless the authorization is terminated  or revoked sooner.       Influenza A by PCR NEGATIVE NEGATIVE Final   Influenza B by PCR NEGATIVE NEGATIVE Final    Comment: (NOTE) The Xpert Xpress SARS-CoV-2/FLU/RSV plus assay is intended as an aid in the diagnosis of influenza from Nasopharyngeal swab specimens and should not be used as a sole basis for treatment. Nasal washings and aspirates are  unacceptable for Xpert Xpress SARS-CoV-2/FLU/RSV testing.  Fact Sheet for Patients: EntrepreneurPulse.com.au  Fact Sheet for Healthcare Providers: IncredibleEmployment.be  This test is not yet approved or cleared by the [place] FDA and has been authorized for detection and/or diagnosis of SARS-CoV-2 by FDA under an Emergency Use Authorization (EUA). This EUA will remain in effect (meaning this test can be used) for the duration of the COVID-19 declaration under Section 564(b)(1) of the Act, 21 U.S.C. section 360bbb-3(b)(1), unless the authorization is terminated or revoked.  Performed at [facility], [address] [place], [place] [address]   Culture, blood (Routine X 2) w Reflex to ID Panel     Status: None (Preliminary result)   Collection Time: 03/06/21 11:29 PM   Specimen: BLOOD  Result Value Ref Range Status   Specimen Description BLOOD BLOOD RIGHT HAND   Final   Special Requests   Final    BOTTLES DRAWN AEROBIC AND ANAEROBIC Blood Culture adequate volume   Culture   Final    NO GROWTH 2 DAYS Performed at [facility], [address], [place], [place] [address]    Report Status PENDING  Incomplete  Culture, blood (Routine X 2) w Reflex to ID Panel     Status: None (Preliminary result)   Collection Time: 03/06/21 11:29 PM   Specimen: BLOOD  Result Value Ref Range Status   Specimen Description BLOOD BLOOD LEFT HAND  Final   Special Requests IN PEDIATRIC BOTTLE Blood Culture adequate volume  Final   Culture   Final    NO GROWTH 2 DAYS Performed at [facility], [address], [place], [place] [address]    Report Status PENDING  Incomplete  Body fluid culture w Gram Stain     Status: None (Preliminary result)   Collection Time: 03/07/21  1:45 PM   Specimen: PATH Cytology Peritoneal fluid  Result Value Ref Range Status   Specimen Description   Final    PERITONEAL Performed at [facility], [address], [place], [place] [address]    Special Requests   Final    NONE Performed at [facility], [address] [place],  [address]    Gram Stain   Final    RARE WBC PRESENT, PREDOMINANTLY MONONUCLEAR NO ORGANISMS SEEN    Culture   Final    NO GROWTH < 12 HOURS Performed at [facility], [place] [address] [place],  [address]    Report Status PENDING  Incomplete         Radiology Studies: CT Head Wo Contrast  Result Date: 03/06/2021 CLINICAL DATA:  Found on floor, fall, pancreatic cancer EXAM: CT HEAD WITHOUT CONTRAST CT CERVICAL SPINE WITHOUT CONTRAST TECHNIQUE: Multidetector CT imaging of the head and cervical spine was performed following the standard protocol without intravenous contrast. Multiplanar CT image reconstructions of the cervical spine were also generated. COMPARISON:  None. FINDINGS: CT HEAD FINDINGS Brain: No evidence of acute infarction, hemorrhage,  hydrocephalus, extra-axial collection or mass lesion/mass effect. Periventricular and deep white matter hypodensity 3 Vascular: No hyperdense vessel or unexpected calcification. Skull: Normal. Negative for fracture or focal lesion. Sinuses/Orbits: No acute finding. Other: None. CT CERVICAL SPINE FINDINGS Alignment: Normal. Skull base and vertebrae: No acute fracture. No primary bone lesion or focal pathologic process. Soft tissues and spinal canal: No prevertebral fluid or swelling. No visible canal hematoma. Disc levels: Intact. Moderate multilevel bilateral facet degenerative change. Upper chest: Negative. Other: None. IMPRESSION: 1. No acute  intracranial pathology. Small-vessel white matter disease. 2. No fracture or static subluxation of the cervical spine. Moderate multilevel bilateral facet degenerative change. Electronically Signed   By: Eddie Candle M.D.   On: 03/06/2021 12:40   CT Cervical Spine Wo Contrast  Result Date: 03/06/2021 CLINICAL DATA:  Found on floor, fall, pancreatic cancer EXAM: CT HEAD WITHOUT CONTRAST CT CERVICAL SPINE WITHOUT CONTRAST TECHNIQUE: Multidetector CT imaging of the head and cervical spine was performed following the standard protocol without intravenous contrast. Multiplanar CT image reconstructions of the cervical spine were also generated. COMPARISON:  None. FINDINGS: CT HEAD FINDINGS Brain: No evidence of acute infarction, hemorrhage, hydrocephalus, extra-axial collection or mass lesion/mass effect. Periventricular and deep white matter hypodensity 3 Vascular: No hyperdense vessel or unexpected calcification. Skull: Normal. Negative for fracture or focal lesion. Sinuses/Orbits: No acute finding. Other: None. CT CERVICAL SPINE FINDINGS Alignment: Normal. Skull base and vertebrae: No acute fracture. No primary bone lesion or focal pathologic process. Soft tissues and spinal canal: No prevertebral fluid or swelling. No visible canal hematoma. Disc levels: Intact. Moderate  multilevel bilateral facet degenerative change. Upper chest: Negative. Other: None. IMPRESSION: 1. No acute intracranial pathology. Small-vessel white matter disease. 2. No fracture or static subluxation of the cervical spine. Moderate multilevel bilateral facet degenerative change. Electronically Signed   By: Eddie Candle M.D.   On: 03/06/2021 12:40   US Paracentesis  Result Date: 03/07/2021 INDICATION: Patient with recently diagnosed pancreatic cancer presents today for a diagnostic and therapeutic paracentesis. EXAM: ULTRASOUND GUIDED PARACENTESIS MEDICATIONS: 1% lidocaine 10 mL COMPLICATIONS: None immediate. PROCEDURE: Informed written consent was obtained from the patient after a discussion of the risks, benefits and alternatives to treatment. A timeout was performed prior to the initiation of the procedure. Initial ultrasound scanning demonstrates a large amount of ascites within the right lower abdominal quadrant. The right lower abdomen was prepped and draped in the usual sterile fashion. 1% lidocaine was used for local anesthesia. Following this, a 6 Fr Safe-T-Centesis catheter was introduced. An ultrasound image was saved for documentation purposes. The paracentesis was performed. The catheter was removed and a dressing was applied. The patient tolerated the procedure well without immediate post procedural complication. FINDINGS: A total of approximately 700 mL of clear yellow fluid was removed. Samples were sent to the laboratory as requested by the clinical team. IMPRESSION: Successful ultrasound-guided paracentesis yielding 700 mL of peritoneal fluid. Read by: Soyla Dryer, NP Electronically Signed   By: Aletta Edouard M.D.   On: 03/07/2021 14:50   CT Angio Abd/Pel w/ and/or w/o  Result Date: 03/06/2021 CLINICAL DATA:  70 year old with suspected abdominal mass. History of pancreatic cancer. EXAM: CTA ABDOMEN AND PELVIS WITH CONTRAST TECHNIQUE: Multidetector CT imaging of the abdomen and  pelvis was performed using the standard protocol during bolus administration of intravenous contrast. Multiplanar reconstructed images and MIPs were obtained and reviewed to evaluate the vascular anatomy. CONTRAST:  31mL OMNIPAQUE IOHEXOL 350 MG/ML SOLN COMPARISON:  CT report from 12/17/2020 from [place]. 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 Aortic stent below the SMA. Small amount of mural thrombus within the main aortic graft body but no significant stenosis. Abdominal aortic aneurysm roughly measures 2.5 x 2.6 cm on sequence 5 5, image 144. Limited evaluation for an endoleak due to the study technique. No evidence for aortic rupture. Celiac: Main celiac trunk is patent and small. Main branches coming off the main celiac trunk are the common  hepatic artery and the splenic artery. The splenic artery has a very beaded configuration and could represent fibromuscular dysplasia. The left gastric artery originates just proximal to the celiac trunk and there is a replaced left hepatic artery. SMA: SMA is patent with a replaced right hepatic artery. Renals: Left renal artery is widely patent without aneurysm, dissection or significant stenosis. Right renal artery is patent but difficult to exclude mild narrowing near the origin. In addition, there is a small accessory right renal artery supplying the lower pole that is likely excluded from the aortic stent graft with retrograde flow. IMA: Origin is occluded due to the aortic stent graft. There is reconstitution of IMA branches and limited evaluation for endoleak. Inflow: Bifurcated aortic stent graft extends into the bilateral iliac arteries. Left-sided stents terminate near the distal left common iliac artery. The distal left common iliac artery measures up to 2.3 cm without a significant aneurysm sac. Patient has stents involving the right external and right internal iliac artery.  Bilateral internal iliac arteries are patent. There appears to be a saccular type aneurysm involving the left external iliac artery that roughly measures 1 cm on sequence 5, image 178. Atherosclerotic plaque and stenosis in the distal right external iliac artery. Proximal Outflow: Limited evaluation of the proximal outflow vessels due to timing of the examination. There appears to be bypass graft in the left groin. Limited evaluation of this left femoral bypass. Atherosclerotic plaque involving the right common femoral artery. Veins: Limited evaluation due to the phase of contrast. Review of the MIP images confirms the above findings. NON-VASCULAR Lower chest: Tiny nodules in the right lower lobe were probably present on the exam from 10/28/2020. Small amount of dependent atelectasis in the right lower lobe. No large pleural effusions. Underlying emphysematous changes in the lungs. Hepatobiliary: Large amount of perihepatic ascites. There is a metallic biliary stent with pneumobilia. Limited evaluation for hepatic lesions to the phase of contrast but there is subtle low-density in left hepatic lobe near segment 2 and segment 4A that measures roughly 3.7 cm. Based on the previous [facility] report, patient had lesion in this area. Difficult to exclude interval enlargement based on these images. Pancreas: Limited evaluation due to the phase of contrast. However, there are at least 3 cystic areas near the pancreatic tail region that are most likely related to the known mass in this area. Largest cystic area measures up to 3.4 cm on sequence 12, image 6. The largest cystic area is probably abutting or involving the posterior gastric wall. Spleen: Spleen size is within normal limits. Adrenals/Urinary Tract: Limited evaluation of the adrenal glands. Atrophy or old infarct in the inferior right kidney related to the excluded right accessory renal artery. Stomach/Bowel: No significant dilatation of bowel structures. Very limited  evaluation for bowel inflammation due to phase of contrast. Cystic lesion near the pancreatic tail appears to be involving or abutting the posterior wall the stomach. Lymphatic: Soft tissue fullness in the left periaortic region that could represent lymphadenopathy but poorly characterized. Abnormal stranding or soft tissue in the upper abdomen near the pancreas Reproductive: Limited evaluation of the prostate. Large fluid collection on the right side of scrotum could represent a large hydrocele. Other: Large amount of ascites in the abdomen and pelvis. Limited evaluation for peritoneal or omental disease on this examination. Musculoskeletal: No acute bone abnormality. IMPRESSION: VASCULAR 1. Bifurcated aortic stent graft is patent. Limited evaluation of the aneurysm sac and limited evaluation for an endoleak. 2. 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 Variant celiac artery anatomy as described. 3. Atrophy or scarring involving the right kidney lower pole likely related to the excluded accessory right renal artery. 4. 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 NON-VASCULAR 1. Large amount of abdominal and pelvic ascites. This appears to be a new finding based on the previous report. 2. Limited evaluation of the patient's known pancreatic mass. Poorly visualized cystic lesions along the posterior wall the stomach and near the pancreatic tail likely represent the known pancreatic lesion. In addition there, is abnormal soft tissue near the celiac trunk and periaortic regions that could represent lymphadenopathy. Difficult to exclude omental thickening on this examination. 3. Metallic biliary stent with pneumobilia. 4. Probable low-density lesion in left hepatic lobe which corresponds with the previous report from [facility]. This area is poorly defined but may have enlarged since the previous examination. Findings are worrisome for progression of disease. 5.  Probable right hydrocele. Electronically Signed   By: Markus Daft M.D.   On: 03/06/2021 13:09        Scheduled Meds: . Chlorhexidine Gluconate Cloth  6 each Topical Daily  . feeding supplement  1 Container Oral TID BM  . lactulose  20 g Oral Once   Continuous Infusions:   LOS: 2 days    Time spent: 34 mins     Wyvonnia Dusky, MD [facility] Pager [phone]  If 7PM-7AM, please contact night-coverage 03/08/2021, 11:09 AM

## 2021-03-09 DIAGNOSIS — R7401 Elevation of levels of liver transaminase levels: Secondary | ICD-10-CM

## 2021-03-09 LAB — COMPREHENSIVE METABOLIC PANEL
ALT: 99 U/L — ABNORMAL HIGH (ref 0–44)
AST: 103 U/L — ABNORMAL HIGH (ref 15–41)
Albumin: 2.4 g/dL — ABNORMAL LOW (ref 3.5–5.0)
Alkaline Phosphatase: 530 U/L — ABNORMAL HIGH (ref 38–126)
Anion gap: 6 (ref 5–15)
BUN: 39 mg/dL — ABNORMAL HIGH (ref 8–23)
CO2: 24 mmol/L (ref 22–32)
Calcium: 8.1 mg/dL — ABNORMAL LOW (ref 8.9–10.3)
Chloride: 108 mmol/L (ref 98–111)
Creatinine, Ser: 1.03 mg/dL (ref 0.61–1.24)
GFR, Estimated: >60 mL/min (ref >60)
Glucose, Bld: 99 mg/dL (ref 70–99)
Potassium: 3.9 mmol/L (ref 3.5–5.1)
Sodium: 138 mmol/L (ref 135–145)
Total Bilirubin: 2.3 mg/dL — ABNORMAL HIGH (ref 0.3–1.2)
Total Protein: 5.9 g/dL — ABNORMAL LOW (ref 6.5–8.1)

## 2021-03-09 LAB — CBC
HCT: 30.1 % — ABNORMAL LOW (ref 39.0–52.0)
Hemoglobin: 10.1 g/dL — ABNORMAL LOW (ref 13.0–17.0)
MCH: 31.1 pg (ref 26.0–34.0)
MCHC: 33.6 g/dL (ref 30.0–36.0)
MCV: 92.6 fL (ref 80.0–100.0)
Platelets: 73 10*3/uL — ABNORMAL LOW (ref 150–400)
RBC: 3.25 MIL/uL — ABNORMAL LOW (ref 4.22–5.81)
RDW: 15.3 % (ref 11.5–15.5)
WBC: 4.8 10*3/uL (ref 4.0–10.5)

## 2021-03-09 LAB — PH, BODY FLUID: pH, Body Fluid: 7.6

## 2021-03-09 LAB — MAGNESIUM: Magnesium: 1.7 mg/dL (ref 1.7–2.4)

## 2021-03-09 LAB — CALCIUM, IONIZED: Calcium, Ionized, Serum: 4.9 mg/dL (ref 4.5–5.6)

## 2021-03-09 LAB — AMMONIA: Ammonia: 108 umol/L — ABNORMAL HIGH (ref 9–35)

## 2021-03-09 MED ORDER — OXYCODONE HCL 5 MG PO TABS
20.0000 mg | ORAL_TABLET | ORAL | Status: DC | PRN
Start: 2021-03-09 — End: 2021-03-12
  Administered 2021-03-09 – 2021-03-12 (×14): 20 mg via ORAL
  Filled 2021-03-09 (×14): qty 4

## 2021-03-09 MED ORDER — OXYCODONE HCL 5 MG PO TABS: 20.0000 mg | ORAL_TABLET | Freq: Four times a day (QID) | ORAL | Status: DC | PRN

## 2021-03-09 MED ORDER — KETOROLAC TROMETHAMINE 15 MG/ML IJ SOLN
15.0000 mg | Freq: Three times a day (TID) | INTRAMUSCULAR | Status: DC | PRN
  Administered 2021-03-09 – 2021-03-12 (×7): 15 mg via INTRAVENOUS
  Filled 2021-03-09 (×8): qty 1

## 2021-03-09 NOTE — Progress Notes (Signed)
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 Still w/ elevated ammonia but trending down   AKI: resolved   Hypomagnesemia: within normal limits   Elevated troponin: likely secondary to demand ischemia   HTN: continue on CCB. Continue to hold lisinopril, HCTZ as BP is low normal   Chronic tobacco abuse: smoking cessation counseling   Ascitis: s/p US paracentesis w/ 736mL of peritoneal fluid removed   Hx of pancreatic cancer: stage IV w/ liver mets. Recently had chemo. CT scan concern for progressive disease. Oxycodone, toradol, & dilaudid prn    Thrombocytopenia: likely secondary to recent chemo. Will continue to monitor   Transaminitis: labile, likely secondary to liver mets. Will continue to monitor   Likely severe protein calorie malnutrition: will consult nutrition tomorrow    DVT prophylaxis: SCDs Code Status: full  Family Communication:  Disposition Plan: SNF vs HH   Level of care: Med-Surg   Status is: Inpatient  Remains inpatient appropriate because:Unsafe d/c plan, IV treatments appropriate due to intensity of illness or inability to take PO and Inpatient level of care appropriate due to severity of illness   Dispo: The patient is from: Home              Anticipated d/c is to: SNF              Patient currently is not medically stable to d/c.   Difficult to place patient Yes    Consultants:    onco   Procedures:    Antimicrobials:    Subjective: Pt c/o abd pain   Objective: Vitals:   03/08/21 1938 03/09/21 0015 03/09/21 0538 03/09/21 0649  BP: 105/75 99/72 99/76    Pulse: 83 78 73   Resp: 16 15 16    Temp: 98.6 F (37 C) 98.6 F (37 C) (!) 97.5 F (36.4 C)   TempSrc: Oral Oral Oral   SpO2: 100% 100% 100%   Weight:    56.5 kg  Height:        Intake/Output Summary (Last 24 hours) at 03/09/2021 0719 Last data filed at 03/08/2021 1839 Gross per 24 hour  Intake 360 ml  Output --  Net 360 ml   Filed Weights   03/06/21 0846 03/07/21 0417 03/09/21 0649  Weight: 59 kg 56.5 kg 56.5 kg    Examination:  General exam: Appears uncomfortable. Cachetic appearing  Respiratory system: clear breath sounds b/l  Cardiovascular system: S1/S2+. No rubs or gallops  Gastrointestinal system: Abd is tender to palpation, distended & hypoactive bowel sounds  Central nervous system: Alert and oriented. Moves all 4 extremities  Psychiatry: Judgement and insight appear normal. Flat mood and affect     Data Reviewed: I have personally reviewed following labs and imaging studies  CBC: Recent Labs  Lab 03/06/21 0855 03/07/21 0522 03/08/21 0758 03/09/21 0442  WBC 4.6 5.4 5.4 4.8  NEUTROABS 3.3  --   --   --   HGB 10.9* 10.9* 11.2* 10.1*  HCT 32.3* 32.1* 33.0* 30.1*  MCV 92.6 90.9 90.4 92.6  PLT 100* 101* 88* 73*   Basic Metabolic Panel: Recent Labs  Lab 03/06/21 0855 03/07/21 0522 03/08/21 0758 03/09/21 0442  NA 140 139 135 138  K 4.2 4.2 3.7 3.9  CL 107 107 104 108  CO2 25 25 22 24   GLUCOSE 72 84 103* 99  BUN 41* 53* 45* 39*  CREATININE 1.42* 1.36* 1.19 1.03  CALCIUM 8.7* 8.8* 8.3* 8.1*  MG 1.6* 1.9  --  1.7   GFR: Estimated Creatinine Clearance: 54.9 mL/min (by C-G formula based on SCr of 1.03 mg/dL). Liver Function Tests: Recent Labs  Lab 03/06/21 0855 03/07/21 0522 03/09/21 0442  AST 59* 59* 103*  ALT 97* 94* 99*  ALKPHOS 513* 486* 530*  BILITOT  2.9* 2.6* 2.3*  PROT 6.8 6.4* 5.9*  ALBUMIN 2.8* 2.7* 2.4*   Recent Labs  Lab 03/06/21 0855  LIPASE 25   Recent Labs  Lab 03/06/21 0854 03/08/21 0758 03/09/21 0442  AMMONIA 112* 110* 108*   Coagulation Profile: Recent Labs  Lab 03/06/21 0855 03/07/21 0522  INR 1.4* 1.6*   Cardiac Enzymes: Recent Labs  Lab 03/06/21 0855  CKTOTAL 129   BNP (last 3 results) No results for input(s): PROBNP in the last 8760 hours. HbA1C: No results for input(s): HGBA1C in the last 72 hours. CBG: Recent Labs  Lab 03/06/21 2228  GLUCAP 133*   Lipid Profile: No results for input(s): CHOL, HDL, LDLCALC, TRIG, CHOLHDL, LDLDIRECT in the last 72 hours. Thyroid Function Tests: Recent Labs    03/07/21 0522  TSH 2.696   Anemia Panel: No results for input(s): VITAMINB12, FOLATE, FERRITIN, TIBC, IRON, RETICCTPCT in the last 72 hours. Sepsis Labs: No results for input(s): PROCALCITON, LATICACIDVEN in the last 168 hours.  Recent Results (from the past 240 hour(s))  Resp Panel by RT-PCR (Flu A&B, Covid) Nasopharyngeal Swab     Status: None   Collection Time: 03/06/21  8:48 AM   Specimen: Nasopharyngeal Swab; Nasopharyngeal(NP) swabs in vial transport medium  Result Value Ref Range Status   SARS Coronavirus 2 by RT PCR NEGATIVE NEGATIVE Final    Comment: (NOTE) SARS-CoV-2 target nucleic acids are NOT DETECTED.  The SARS-CoV-2 RNA is generally detectable in upper respiratory specimens during the acute phase of infection. The lowest concentration of SARS-CoV-2 viral copies this assay can detect is 138 copies/mL. A negative result does not preclude SARS-Cov-2 infection and should not be used as the sole basis for treatment or other patient management decisions. A negative result may occur with  improper specimen collection/handling, submission of specimen other than nasopharyngeal swab, presence of viral mutation(s) within the areas targeted by this assay, and inadequate number of  viral copies(<138 copies/mL). A negative result must be combined with clinical observations, patient history, and epidemiological information. The expected result is Negative.  Fact Sheet for Patients:  EntrepreneurPulse.com.au  Fact Sheet for Healthcare Providers:  IncredibleEmployment.be  This test is no t yet approved or cleared by the [place] FDA and  has been authorized for detection and/or diagnosis of SARS-CoV-2 by FDA under an Emergency Use Authorization (EUA). This EUA will remain  in effect (meaning this test can be used) for the duration of the COVID-19 declaration under Section 564(b)(1) of the Act, 21 U.S.C.section 360bbb-3(b)(1), unless the authorization is terminated  or revoked sooner.       Influenza A by PCR NEGATIVE NEGATIVE Final   Influenza B by PCR NEGATIVE NEGATIVE Final    Comment: (NOTE) The  Xpert Xpress SARS-CoV-2/FLU/RSV plus assay is intended as an aid in the diagnosis of influenza from Nasopharyngeal swab specimens and should not be used as a sole basis for treatment. Nasal washings and aspirates are unacceptable for Xpert Xpress SARS-CoV-2/FLU/RSV testing.  Fact Sheet for Patients: EntrepreneurPulse.com.au  Fact Sheet for Healthcare Providers: IncredibleEmployment.be  This test is not yet approved or cleared by the [place] FDA and has been authorized for detection and/or diagnosis of SARS-CoV-2 by FDA under an Emergency Use Authorization (EUA). This EUA will remain in effect (meaning this test can be used) for the duration of the COVID-19 declaration under Section 564(b)(1) of the Act, 21 U.S.C. section 360bbb-3(b)(1), unless the authorization is terminated or revoked.  Performed at [facility], [address] [place], [place] [address]   Culture, blood (Routine X 2) w Reflex to ID Panel     Status: None (Preliminary result)   Collection Time:  03/06/21 11:29 PM   Specimen: BLOOD  Result Value Ref Range Status   Specimen Description BLOOD BLOOD RIGHT HAND  Final   Special Requests   Final    BOTTLES DRAWN AEROBIC AND ANAEROBIC Blood Culture adequate volume   Culture   Final    NO GROWTH 3 DAYS Performed at [facility], [address], [place], [place] [address]    Report Status PENDING  Incomplete  Culture, blood (Routine X 2) w Reflex to ID Panel     Status: None (Preliminary result)   Collection Time: 03/06/21 11:29 PM   Specimen: BLOOD  Result Value Ref Range Status   Specimen Description BLOOD BLOOD LEFT HAND  Final   Special Requests IN PEDIATRIC BOTTLE Blood Culture adequate volume  Final   Culture   Final    NO GROWTH 3 DAYS Performed at [facility], [address], [place], [place] [address]    Report Status PENDING  Incomplete  Body fluid culture w Gram Stain     Status: None (Preliminary result)   Collection Time: 03/07/21  1:45 PM   Specimen: PATH Cytology Peritoneal fluid  Result Value Ref Range Status   Specimen Description   Final    PERITONEAL Performed at [facility], [address] [place], [place] [address]    Special Requests   Final    NONE Performed at [facility], [address] [place], [place] [address]    Gram Stain   Final    RARE WBC PRESENT, PREDOMINANTLY MONONUCLEAR NO ORGANISMS SEEN    Culture   Final    NO GROWTH < 12 HOURS Performed at [facility], [place] [address] [place], [place] [address]    Report Status PENDING  Incomplete         Radiology Studies: US Paracentesis  Result Date: 03/07/2021 INDICATION: Patient with recently diagnosed pancreatic cancer presents today for a diagnostic and therapeutic paracentesis. EXAM: ULTRASOUND GUIDED PARACENTESIS MEDICATIONS: 1% lidocaine 10 mL COMPLICATIONS: None immediate. PROCEDURE: Informed written consent was obtained from the patient after a discussion of the risks, benefits and  alternatives to treatment. A timeout was performed prior to the initiation of the procedure. Initial ultrasound scanning demonstrates a large amount of ascites within the right lower abdominal quadrant. The right lower abdomen was prepped and draped in the usual sterile fashion. 1% lidocaine was used for local anesthesia. Following this, a 6 Fr Safe-T-Centesis catheter was introduced. An ultrasound image was saved for documentation purposes. The paracentesis was performed. The catheter was removed  and a dressing was applied. The patient tolerated the procedure well without immediate post procedural complication. FINDINGS: A total of approximately 700 mL of clear yellow fluid was removed. Samples were sent to the laboratory as requested by the clinical team. IMPRESSION: Successful ultrasound-guided paracentesis yielding 700 mL of peritoneal fluid. Read by: Soyla Dryer, NP Electronically Signed   By: Aletta Edouard M.D.   On: 03/07/2021 14:50        Scheduled Meds: . Chlorhexidine Gluconate Cloth  6 each Topical Daily  . feeding supplement  1 Container Oral TID BM  . lactulose  20 g Oral Once   Continuous Infusions: . ondansetron (ZOFRAN) IV       LOS: 3 days    Time spent: 31 mins     Wyvonnia Dusky, MD [facility] Pager [phone]  If 7PM-7AM, please contact night-coverage 03/09/2021, 7:19 AM

## 2021-03-10 DIAGNOSIS — Z515 Encounter for palliative care: Secondary | ICD-10-CM

## 2021-03-10 DIAGNOSIS — G9341 Metabolic encephalopathy: Secondary | ICD-10-CM | POA: Diagnosis not present

## 2021-03-10 LAB — CBC
HCT: 35.1 % — ABNORMAL LOW (ref 39.0–52.0)
Hemoglobin: 11.7 g/dL — ABNORMAL LOW (ref 13.0–17.0)
MCH: 30.7 pg (ref 26.0–34.0)
MCHC: 33.3 g/dL (ref 30.0–36.0)
MCV: 92.1 fL (ref 80.0–100.0)
Platelets: 66 10*3/uL — ABNORMAL LOW (ref 150–400)
RBC: 3.81 MIL/uL — ABNORMAL LOW (ref 4.22–5.81)
RDW: 15.5 % (ref 11.5–15.5)
WBC: 11.9 10*3/uL — ABNORMAL HIGH (ref 4.0–10.5)
nRBC: 0 % (ref 0.0–0.2)

## 2021-03-10 LAB — MAGNESIUM: Magnesium: 1.8 mg/dL (ref 1.7–2.4)

## 2021-03-10 LAB — COMPREHENSIVE METABOLIC PANEL
ALT: 130 U/L — ABNORMAL HIGH (ref 0–44)
AST: 149 U/L — ABNORMAL HIGH (ref 15–41)
Albumin: 2.5 g/dL — ABNORMAL LOW (ref 3.5–5.0)
Alkaline Phosphatase: 600 U/L — ABNORMAL HIGH (ref 38–126)
Anion gap: 7 (ref 5–15)
BUN: 44 mg/dL — ABNORMAL HIGH (ref 8–23)
CO2: 24 mmol/L (ref 22–32)
Calcium: 8.3 mg/dL — ABNORMAL LOW (ref 8.9–10.3)
Chloride: 108 mmol/L (ref 98–111)
Creatinine, Ser: 1.29 mg/dL — ABNORMAL HIGH (ref 0.61–1.24)
GFR, Estimated: >60 mL/min (ref >60)
Glucose, Bld: 108 mg/dL — ABNORMAL HIGH (ref 70–99)
Potassium: 4.2 mmol/L (ref 3.5–5.1)
Sodium: 139 mmol/L (ref 135–145)
Total Bilirubin: 2.4 mg/dL — ABNORMAL HIGH (ref 0.3–1.2)
Total Protein: 6.2 g/dL — ABNORMAL LOW (ref 6.5–8.1)

## 2021-03-10 LAB — AMMONIA: Ammonia: 34 umol/L (ref 9–35)

## 2021-03-10 MED ORDER — ENSURE ENLIVE PO LIQD
237.0000 mL | Freq: Three times a day (TID) | ORAL | Status: DC
  Administered 2021-03-10 – 2021-03-13 (×6): 237 mL via ORAL

## 2021-03-10 NOTE — Progress Notes (Signed)
PROGRESS NOTE   HPI was taken from Dr. Velia Gill: Clayton Gill is a 70 y.o. male with medical history significant for recently diagnosed pancreatic cancer on chemotherapy, biliary obstruction status post placement of biliary stent, hypertension, hyperlipidemia, chronic hepatitis C, AAA status post repair, who is admitted to [facility] on 03/06/2021 with acute metabolic encephalopathy after presenting from home to [facility] ED for evaluation of altered mental status.   In the context of the patient's altered mental status, his provision of history is limited at this time.  Additional history is obtained via discussions with the emergency department physician and via chart review.  The patient reports that he was diagnosed with pancreatic cancer approximately 3 weeks ago, with interval initiation of chemotherapy x 1 session, which he reports occurred 1 week ago.  The patient was reportedly scheduled for his next chemo therapy session today.  When the patient's neighbor, who was responsible for taking the patient to this chemotherapy session, arrived at the patient's home, he found the patient to be laying in bed and noted him to be somnolent, lethargic, and mildly confused relative to baseline, prompting the patient to be brought to [facility] ED for further evaluation of such.  The patient's friend also noted that the patient had fallen yesterday, although the details of this fall are unclear as it was unwitnessed by the patient's friend.  The patient does not recall the details relating to this fall, and is unsure if he lost consciousness or if he hit his head.  The patient reports recent development of mild abdominal swelling in the absence of any associated abdominal discomfort.  He notes new onset dysuria over the last few days, in the absence of any associated gross hematuria or change in urinary urgency/frequency.  Denies any chest pain, shortness of breath, palpitations,  paresthesias.  Denies any recent nausea/vomiting, but reports recent decline in oral intake in the setting of decline in appetite.  Denies any recent diarrhea.   Denies any recent subjective fever, chills, rigors, or generalized myalgias.  No recent headache, neck stiffness, cough.   Per chart review, it appears that the patient has a history of biliary obstruction, status post placement of metallic biliary stent.  Most recent prior liver enzymes, per chart review, were drawn on 02/19/2021 and were notable for the following: Albumin 2.7, alkaline phosphatase 769, AST 320, ALT 424, total bilirubin 4.7.  Per chart review, patient has a history of chronic hepatitis C, with most recent HCVRNA PCR showing a nondetectable viral load on 12/18/2020.  The patient reports that he he was treated with antiviral therapy as an outpatient, and conveys that he completed this therapy approximately 6 months ago.  Per chart review, it appears that the patient has previously followed with Clayton Gill, specifically with Dr. Vicente Gill.   Hospital Course from Dr. Jimmye Gill 4/2-03/10/21: Pt's mental status has waxed and waned throughout hospital stay. Pt has poor functional status and pt is very malnourished. Palliative care was consulted (Gill) and will see pt. PT/OT recs SNF but pt wants to go home instead and thinks he will be fine at home by himself. Reached out to pt's uncle on file but no answer so I left a message. 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 Ammonia level is still elevated but continues to trend down daily  Hx of pancreatic cancer: stage IV w/ liver mets. Recently had chemo. CT scan concern for progressive disease. Oxycodone, toradol, & dilaudid prn. Very poor prognosis. Palliative care consulted (Clayton Gill)  AKI: Cr is labile. Avoid nephrotoxic meds   Hypomagnesemia: WNL today    Elevated troponin: likely secondary to demand ischemia   HTN: BP is on low end of normal, continue to hold lisinopril, HCTZ.    Chronic tobacco abuse: smoking cessation counseling    Ascitis: s/p US paracentesis w/ 723mL of peritoneal fluid removed   Thrombocytopenia: possibly secondary to chemo. Will continue to monitor   Transaminitis: likely secondary to liver mets   Likely severe protein calorie malnutrition: will consult nutrition today    DVT prophylaxis: SCDs Code Status: full  Family Communication: called pt's uncle, Clayton Gill, no answer so I left a voicemail  Disposition Plan: SNF vs HH   Level of care: Med-Surg   Status is: Inpatient  Remains inpatient appropriate because:Unsafe d/c plan, IV treatments appropriate due to intensity of illness or inability to take PO and Inpatient level of care appropriate due to severity of illness   Dispo: The patient is from: Home              Anticipated d/c is to: SNF              Patient currently is not medically stable to d/c.   Difficult to place patient Yes    Consultants:   Onco  Palliative care (Clayton Gill)   Procedures:    Antimicrobials:    Subjective: Pt c/o weakness   Objective: Vitals:   03/09/21 1713 03/09/21 2013 03/10/21 0036 03/10/21 0503  BP: 109/83 95/78 103/79 102/81  Pulse: 89 88 95 89  Resp: 14  16 16   Temp: 99.2 F (37.3 C) 98.7 F (37.1 C) 99.3 F (37.4 C) (!) 97.4 F (36.3 C)  TempSrc:   Oral Oral  SpO2: 100% 100% 100% 100%  Weight:    54.2 kg  Height:         Intake/Output Summary (Last 24 hours) at 03/10/2021 0729 Last data filed at 03/09/2021 1416 Gross per 24 hour  Intake 120 ml  Output --  Net 120 ml   Filed Weights   03/07/21 0417 03/09/21 0649 03/10/21 0503  Weight: 56.5 kg 56.5 kg 54.2 kg    Examination:  General exam: Cachetic appearing.  Respiratory system: diminished breath sounds b/l otherwise clear  Cardiovascular system: S1 & S2+. No rubs or gallops  Gastrointestinal system: Abd is tender, distended & normal bowel sounds   Central nervous system: Alert and awake. Moves all 4 extremities  Psychiatry: Judgement and insight appear normal. Flat mood and affect     Data Reviewed: I have personally reviewed following labs and imaging studies  CBC: Recent Labs  Lab 03/06/21 0855 03/07/21 0522 03/08/21 0758 03/09/21 0442 03/10/21 0629  WBC 4.6 5.4 5.4 4.8 11.9*  NEUTROABS 3.3  --   --   --   --   HGB 10.9* 10.9* 11.2* 10.1* 11.7*  HCT 32.3* 32.1* 33.0* 30.1* 35.1*  MCV 92.6 90.9 90.4 92.6 92.1  PLT 100*  101* 88* 73* 66*   Basic Metabolic Panel: Recent Labs  Lab 03/06/21 0855 03/07/21 0522 03/08/21 0758 03/09/21 0442 03/10/21 0629  NA 140 139 135 138 139  K 4.2 4.2 3.7 3.9 4.2  CL 107 107 104 108 108  CO2 25 25 22 24 24   GLUCOSE 72 84 103* 99 108*  BUN 41* 53* 45* 39* 44*  CREATININE 1.42* 1.36* 1.19 1.03 1.29*  CALCIUM 8.7* 8.8* 8.3* 8.1* 8.3*  MG 1.6* 1.9  --  1.7 1.8   GFR: Estimated Creatinine Clearance: 42 mL/min (A) (by C-G formula based on SCr of 1.29 mg/dL (H)). Liver Function Tests: Recent Labs  Lab 03/06/21 0855 03/07/21 0522 03/09/21 0442 03/10/21 0629  AST 59* 59* 103* 149*  ALT 97* 94* 99* 130*  ALKPHOS 513* 486* 530* 600*  BILITOT 2.9* 2.6* 2.3* 2.4*  PROT 6.8 6.4* 5.9* 6.2*  ALBUMIN 2.8* 2.7* 2.4* 2.5*   Recent Labs  Lab 03/06/21 0855  LIPASE 25   Recent Labs  Lab 03/06/21 0854 03/08/21 0758 03/09/21 0442 03/10/21 0629  AMMONIA 112* 110* 108* 34   Coagulation  Profile: Recent Labs  Lab 03/06/21 0855 03/07/21 0522  INR 1.4* 1.6*   Cardiac Enzymes: Recent Labs  Lab 03/06/21 0855  CKTOTAL 129   BNP (last 3 results) No results for input(s): PROBNP in the last 8760 hours. HbA1C: No results for input(s): HGBA1C in the last 72 hours. CBG: Recent Labs  Lab 03/06/21 2228  GLUCAP 133*   Lipid Profile: No results for input(s): CHOL, HDL, LDLCALC, TRIG, CHOLHDL, LDLDIRECT in the last 72 hours. Thyroid Function Tests: No results for input(s): TSH, T4TOTAL, FREET4, T3FREE, THYROIDAB in the last 72 hours. Anemia Panel: No results for input(s): VITAMINB12, FOLATE, FERRITIN, TIBC, IRON, RETICCTPCT in the last 72 hours. Sepsis Labs: No results for input(s): PROCALCITON, LATICACIDVEN in the last 168 hours.  Recent Results (from the past 240 hour(s))  Resp Panel by RT-PCR (Flu A&B, Covid) Nasopharyngeal Swab     Status: None   Collection Time: 03/06/21  8:48 AM   Specimen: Nasopharyngeal Swab; Nasopharyngeal(NP) swabs in vial transport medium  Result Value Ref Range Status   SARS Coronavirus 2 by RT PCR NEGATIVE NEGATIVE Final    Comment: (NOTE) SARS-CoV-2 target nucleic acids are NOT DETECTED.  The SARS-CoV-2 RNA is generally detectable in upper respiratory specimens during the acute phase of infection. The lowest concentration of SARS-CoV-2 viral copies this assay can detect is 138 copies/mL. A negative result does not preclude SARS-Cov-2 infection and should not be used as the sole basis for treatment or other patient management decisions. A negative result may occur with  improper specimen collection/handling, submission of specimen other than nasopharyngeal swab, presence of viral mutation(s) within the areas targeted by this assay, and inadequate number of viral copies(<138 copies/mL). A negative result must be combined with clinical observations, patient history, and epidemiological information. The expected result is  Negative.  Fact Sheet for Patients:  EntrepreneurPulse.com.au  Fact Sheet for Healthcare Providers:  IncredibleEmployment.be  This test is no t yet approved or cleared by the [place] FDA and  has been authorized for detection and/or diagnosis of SARS-CoV-2 by FDA under an Emergency Use Authorization (EUA). This EUA will remain  in effect (meaning this test can be used) for the duration of the COVID-19 declaration under Section 564(b)(1) of the Act, 21 U.S.C.section 360bbb-3(b)(1), unless the authorization is terminated  or revoked sooner.       Influenza A by  PCR NEGATIVE NEGATIVE Final   Influenza B by PCR NEGATIVE NEGATIVE Final    Comment: (NOTE) The Xpert Xpress SARS-CoV-2/FLU/RSV plus assay is intended as an aid in the diagnosis of influenza from Nasopharyngeal swab specimens and should not be used as a sole basis for treatment. Nasal washings and aspirates are unacceptable for Xpert Xpress SARS-CoV-2/FLU/RSV testing.  Fact Sheet for Patients: EntrepreneurPulse.com.au  Fact Sheet for Healthcare Providers: IncredibleEmployment.be  This test is not yet approved or cleared by the [place] FDA and has been authorized for detection and/or diagnosis of SARS-CoV-2 by FDA under an Emergency Use Authorization (EUA). This EUA will remain in effect (meaning this test can be used) for the duration of the COVID-19 declaration under Section 564(b)(1) of the Act, 21 U.S.C. section 360bbb-3(b)(1), unless the authorization is terminated or revoked.  Performed at [facility], [address] [place], [place] [address]   Culture, blood (Routine X 2) w Reflex to ID Panel     Status: None (Preliminary result)   Collection Time: 03/06/21 11:29 PM   Specimen: BLOOD  Result Value Ref Range Status   Specimen Description BLOOD BLOOD RIGHT HAND  Final   Special Requests   Final    BOTTLES DRAWN  AEROBIC AND ANAEROBIC Blood Culture adequate volume   Culture   Final    NO GROWTH 4 DAYS Performed at [facility], [address], [place], [place] [address]    Report Status PENDING  Incomplete  Culture, blood (Routine X 2) w Reflex to ID Panel     Status: None (Preliminary result)   Collection Time: 03/06/21 11:29 PM   Specimen: BLOOD  Result Value Ref Range Status   Specimen Description BLOOD BLOOD LEFT HAND  Final   Special Requests IN PEDIATRIC BOTTLE Blood Culture adequate volume  Final   Culture   Final    NO GROWTH 4 DAYS Performed at [facility], [address], [place], [place] [address]    Report Status PENDING  Incomplete  Body fluid culture w Gram Stain     Status: None (Preliminary result)   Collection Time: 03/07/21  1:45 PM   Specimen: PATH Cytology Peritoneal fluid  Result Value Ref Range Status   Specimen Description   Final    PERITONEAL Performed at [facility], [address], [place], [place] [address]    Special Requests   Final    NONE Performed at [facility], [address] [place], [place] [address]    Gram Stain   Final    RARE WBC PRESENT, PREDOMINANTLY MONONUCLEAR NO ORGANISMS SEEN    Culture   Final    NO GROWTH 2 DAYS Performed at [facility], [place] [address] [place],  [address]    Report Status PENDING  Incomplete         Radiology Studies: No results found.      Scheduled Meds: . Chlorhexidine Gluconate Cloth  6 each Topical Daily  . feeding supplement  1 Container Oral TID BM  . lactulose  20 g Oral Once   Continuous Infusions: . ondansetron (ZOFRAN) IV       LOS: 4 days    Time spent: 33 mins     Wyvonnia Dusky, [facility] Pager [phone]  If 7PM-7AM, please contact night-coverage 03/10/2021, 7:29 AM

## 2021-03-10 NOTE — Progress Notes (Signed)
Clayton Gill   DOB:[birth year]   IO#:[id number]    Subjective: Patient resting in the bed.  No acute distress.  Continues to complain of abdominal pain/back pain.  No fevers or chills.  Feels weak.  Objective:  Vitals:   03/10/21 1200 03/10/21 1648  BP: 102/80 97/71  Pulse: 70 75  Resp:  15  Temp: 98.1 F (36.7 C) 98.4 F (36.9 C)  SpO2: 95% 100%     Intake/Output Summary (Last 24 hours) at 03/10/2021 2058 Last data filed at 03/10/2021 1013 Gross per 24 hour  Intake 240 ml  Output --  Net 240 ml    Physical Exam Constitutional:      Comments: Cachectic appearing male patient.  Resting in the bed.  No acute distress.  Temporal wasting.  HENT:     Head: Normocephalic and atraumatic.     Mouth/Throat:     Pharynx: No oropharyngeal exudate.  Eyes:     Pupils: Pupils are equal, round, and reactive to light.  Cardiovascular:     Rate and Rhythm: Normal rate and regular rhythm.  Pulmonary:     Effort: No respiratory distress.     Breath sounds: No wheezing.     Comments: Decreased breath sounds in the bases bilaterally. Abdominal:     General: Bowel sounds are normal. There is no distension.     Palpations: Abdomen is soft. There is no mass.     Tenderness: There is no abdominal tenderness. There is no guarding or rebound.     Comments: Abdominal distention noted.  Musculoskeletal:        General: No tenderness. Normal range of motion.     Cervical back: Normal range of motion and neck supple.  Skin:    General: Skin is warm.  Neurological:     Mental Status: He is alert and oriented to person, place, and time.  Psychiatric:        Mood and Affect: Affect normal.      Labs:  Lab Results  Component Value Date   WBC 11.9 (H) 03/10/2021   HGB 11.7 (L) 03/10/2021   HCT 35.1 (L) 03/10/2021   MCV 92.1 03/10/2021   PLT 66 (L) 03/10/2021   NEUTROABS 3.3 03/06/2021    Lab Results  Component Value Date   NA 139 03/10/2021   K 4.2 03/10/2021   CL 108 03/10/2021    CO2 24 03/10/2021    Studies:  No results found.  Carcinoma of pancreas metastatic to liver [facility]) #70 year old male patient with pancreatic adenocarcinoma metastatic to the liver is currently admitted to hospital for mental status changes  #Acute mental status changes-likely secondary to narcotic pain medication.  Status post Narcan currently improved.  Stable.  #Metastatic pancreatic adenocarcinoma metastatic liver currently-s/p 1 cycle of FOLFIRINOX chemotherapy [complicated by hospitalization]; current chemotherapy on hold because of acute issues.  Long discussion with patient regarding potential for significant side effects given his poor performance status/multiple comorbidities.  Recommend evaluation with palliative care versus goals of care.  Discussed with [employer].  Discussed with Dr. Jimmye Norman.  Cammie Sickle, MD 03/10/2021  8:58 PM

## 2021-03-10 NOTE — Consult Note (Signed)
[place]  [phone] [phone]   Name: Fredrico Beedle Date: 03/10/2021 MRN: [id number]  DOB: Dec 01, 1951  Patient Care Team: Theotis Burrow, MD as PCP - General (Family Medicine)    REASON FOR CONSULTATION: Clayton Gill is a 70 y.o. 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  Patient has had ongoing pain.  Palliative care was consulted up address goals and manage ongoing symptoms.   SOCIAL HISTORY:     reports that he has been smoking cigarettes. He has a 30.00 pack-year smoking history. He has never used smokeless tobacco. He reports that he does not drink alcohol and does not use drugs.  Patient is unmarried.  He has a girlfriend who is involved in his care.  Patient lives in a boardinghouse.  He has a history of lengthy incarceration throughout his lifetime.  Patient has a son who is also incarcerated until recently.  Patient has an uncle who is involved in his care.  ADVANCE DIRECTIVES:  None on file  CODE STATUS: DNR  PAST MEDICAL HISTORY: Past Medical History:  Diagnosis Date  . Chronic hepatitis C ([place])   . GERD (gastroesophageal reflux disease)   . High cholesterol   . Hypertension   . Rectal bleeding     PAST SURGICAL HISTORY:  Past Surgical History:  Procedure Laterality Date  . ABDOMINAL AORTIC ANEURYSM REPAIR    . CHOLECYSTECTOMY    . COLONOSCOPY    . COLONOSCOPY WITH PROPOFOL N/A 08/20/2016   Procedure: COLONOSCOPY WITH PROPOFOL;  Surgeon: Lollie Sails, MD;  Location: [facility] ENDOSCOPY;  Service: Endoscopy;  Laterality: N/A;  . COLONOSCOPY WITH PROPOFOL N/A 11/06/2019   Procedure: COLONOSCOPY WITH  PROPOFOL;  Surgeon: Jonathon Bellows, MD;  Location: [facility] ENDOSCOPY;  Service: Gastroenterology;  Laterality: N/A;  . HEMORRHOIDECTOMY WITH HEMORRHOID BANDING    . HEMORROIDECTOMY    . LOWER EXTREMITY ANGIOGRAPHY Right 12/11/2019   Procedure: LOWER EXTREMITY ANGIOGRAPHY;  Surgeon: Algernon Huxley, MD;  Location: [place] CV LAB;  Service: Cardiovascular;  Laterality: Right;  . LOWER EXTREMITY ANGIOGRAPHY Right 12/21/2019   Procedure: LOWER EXTREMITY ANGIOGRAPHY;  Surgeon: Algernon Huxley, MD;  Location: [place] CV LAB;  Service: Cardiovascular;  Laterality: Right;  . surgery left leg      HEMATOLOGY/ONCOLOGY HISTORY:  Oncology History   No history exists.    ALLERGIES:  has No Known Allergies.  MEDICATIONS:  Current Facility-Administered Medications  Medication Dose Route Frequency Provider Last Rate Last Admin  . acetaminophen (TYLENOL) tablet 650 mg  650 mg Oral Q6H PRN Howerter, Justin B, DO   650 mg at 03/08/21 0304   Or  . acetaminophen (TYLENOL) suppository 650 mg  650 mg Rectal Q6H PRN Howerter, Justin B, DO      . Chlorhexidine Gluconate Cloth 2 % PADS 6 each  6 each Topical Daily Nolberto Hanlon, MD   6 each at 03/10/21 1052  . feeding supplement (ENSURE ENLIVE / ENSURE PLUS) liquid 237 mL  237 mL Oral TID BM Wyvonnia Dusky, MD   237 mL at 03/10/21 1528  . HYDROmorphone (DILAUDID) injection 1 mg  1 mg Intravenous Q4H PRN Wyvonnia Dusky, MD      . ketorolac (TORADOL) 15 MG/ML injection 15  mg  15 mg Intravenous Q8H PRN Wyvonnia Dusky, MD   15 mg at 03/10/21 0800  . lactulose (CHRONULAC) 10 GM/15ML solution 20 g  20 g Oral Once Howerter, Justin B, DO      . naloxone [facility]) injection 0.4 mg  0.4 mg Intravenous PRN Howerter, Justin B, DO   0.4 mg at 03/06/21 2343  . nicotine (NICODERM CQ - dosed in mg/24 hours) patch 14 mg  14 mg Transdermal Daily PRN Howerter, Justin B, DO      . ondansetron (ZOFRAN) 8 mg in sodium chloride 0.9 % 50 mL IVPB  8 mg Intravenous Q8H PRN  Wyvonnia Dusky, MD      . oxyCODONE (Oxy IR/ROXICODONE) immediate release tablet 20 mg  20 mg Oral Q4H PRN Wyvonnia Dusky, MD   20 mg at 03/10/21 1053    VITAL SIGNS: BP 102/80 (BP Location: Right Arm)   Pulse 70   Temp 98.1 F (36.7 C) (Oral)   Resp 15   Ht '6\' 1"'  (1.854 m)   Wt 119 lb 7.8 oz (54.2 kg)   SpO2 95%   BMI 15.76 kg/m  Filed Weights   03/07/21 0417 03/09/21 0649 03/10/21 0503  Weight: 124 lb 9 oz (56.5 kg) 124 lb 9 oz (56.5 kg) 119 lb 7.8 oz (54.2 kg)    Estimated body mass index is 15.76 kg/m as calculated from the following:   Height as of this encounter: '6\' 1"'  (1.854 m).   Weight as of this encounter: 119 lb 7.8 oz (54.2 kg).  LABS: CBC:    Component Value Date/Time   WBC 11.9 (H) 03/10/2021 0629   HGB 11.7 (L) 03/10/2021 0629   HGB 13.6 06/05/2020 1007   HCT 35.1 (L) 03/10/2021 0629   HCT 40.5 06/05/2020 1007   PLT 66 (L) 03/10/2021 0629   MCV 92.1 03/10/2021 0629   MCV 90 06/05/2020 1007   NEUTROABS 3.3 03/06/2021 0855   NEUTROABS 2.8 06/05/2020 1007   LYMPHSABS 0.9 03/06/2021 0855   LYMPHSABS 1.7 06/05/2020 1007   MONOABS 0.4 03/06/2021 0855   EOSABS 0.0 03/06/2021 0855   EOSABS 0.1 06/05/2020 1007   BASOSABS 0.0 03/06/2021 0855   BASOSABS 0.0 06/05/2020 1007   Comprehensive Metabolic Panel:    Component Value Date/Time   NA 139 03/10/2021 0629   NA 139 04/30/2020 1340   K 4.2 03/10/2021 0629   CL 108 03/10/2021 0629   CO2 24 03/10/2021 0629   BUN 44 (H) 03/10/2021 0629   BUN 12 04/30/2020 1340   CREATININE 1.29 (H) 03/10/2021 0629   GLUCOSE 108 (H) 03/10/2021 0629   CALCIUM 8.3 (L) 03/10/2021 0629   AST 149 (H) 03/10/2021 0629   ALT 130 (H) 03/10/2021 0629   ALKPHOS 600 (H) 03/10/2021 0629   BILITOT 2.4 (H) 03/10/2021 0629   BILITOT 0.4 04/30/2020 1340   PROT 6.2 (L) 03/10/2021 0629   PROT 7.8 04/30/2020 1340   ALBUMIN 2.5 (L) 03/10/2021 0629   ALBUMIN 4.3 04/30/2020 1340    RADIOGRAPHIC STUDIES: CT Head Wo  Contrast  Result Date: 03/06/2021 CLINICAL DATA:  Found on floor, fall, pancreatic cancer EXAM: CT HEAD WITHOUT CONTRAST CT CERVICAL SPINE WITHOUT CONTRAST TECHNIQUE: Multidetector CT imaging of the head and cervical spine was performed following the standard protocol without intravenous contrast. Multiplanar CT image reconstructions of the cervical spine were also generated. COMPARISON:  None. FINDINGS: CT HEAD FINDINGS Brain: No evidence of acute infarction, hemorrhage, hydrocephalus, extra-axial collection or mass  lesion/mass effect. Periventricular and deep white matter hypodensity 3 Vascular: No hyperdense vessel or unexpected calcification. Skull: Normal. Negative for fracture or focal lesion. Sinuses/Orbits: No acute finding. Other: None. CT CERVICAL SPINE FINDINGS Alignment: Normal. Skull base and vertebrae: No acute fracture. No primary bone lesion or focal pathologic process. Soft tissues and spinal canal: No prevertebral fluid or swelling. No visible canal hematoma. Disc levels: Intact. Moderate multilevel bilateral facet degenerative change. Upper chest: Negative. Other: None. IMPRESSION: 1. No acute intracranial pathology. Small-vessel white matter disease. 2. No fracture or static subluxation of the cervical spine. Moderate multilevel bilateral facet degenerative change. Electronically Signed   By: Eddie Candle M.D.   On: 03/06/2021 12:40   CT Cervical Spine Wo Contrast  Result Date: 03/06/2021 CLINICAL DATA:  Found on floor, fall, pancreatic cancer EXAM: CT HEAD WITHOUT CONTRAST CT CERVICAL SPINE WITHOUT CONTRAST TECHNIQUE: Multidetector CT imaging of the head and cervical spine was performed following the standard protocol without intravenous contrast. Multiplanar CT image reconstructions of the cervical spine were also generated. COMPARISON:  None. FINDINGS: CT HEAD FINDINGS Brain: No evidence of acute infarction, hemorrhage, hydrocephalus, extra-axial collection or mass lesion/mass effect.  Periventricular and deep white matter hypodensity 3 Vascular: No hyperdense vessel or unexpected calcification. Skull: Normal. Negative for fracture or focal lesion. Sinuses/Orbits: No acute finding. Other: None. CT CERVICAL SPINE FINDINGS Alignment: Normal. Skull base and vertebrae: No acute fracture. No primary bone lesion or focal pathologic process. Soft tissues and spinal canal: No prevertebral fluid or swelling. No visible canal hematoma. Disc levels: Intact. Moderate multilevel bilateral facet degenerative change. Upper chest: Negative. Other: None. IMPRESSION: 1. No acute intracranial pathology. Small-vessel white matter disease. 2. No fracture or static subluxation of the cervical spine. Moderate multilevel bilateral facet degenerative change. Electronically Signed   By: Eddie Candle M.D.   On: 03/06/2021 12:40   US Paracentesis  Result Date: 03/07/2021 INDICATION: Patient with recently diagnosed pancreatic cancer presents today for a diagnostic and therapeutic paracentesis. EXAM: ULTRASOUND GUIDED PARACENTESIS MEDICATIONS: 1% lidocaine 10 mL COMPLICATIONS: None immediate. PROCEDURE: Informed written consent was obtained from the patient after a discussion of the risks, benefits and alternatives to treatment. A timeout was performed prior to the initiation of the procedure. Initial ultrasound scanning demonstrates a large amount of ascites within the right lower abdominal quadrant. The right lower abdomen was prepped and draped in the usual sterile fashion. 1% lidocaine was used for local anesthesia. Following this, a 6 Fr Safe-T-Centesis catheter was introduced. An ultrasound image was saved for documentation purposes. The paracentesis was performed. The catheter was removed and a dressing was applied. The patient tolerated the procedure well without immediate post procedural complication. FINDINGS: A total of approximately 700 mL of clear yellow fluid was removed. Samples were sent to the laboratory as  requested by the clinical team. IMPRESSION: Successful ultrasound-guided paracentesis yielding 700 mL of peritoneal fluid. Read by: Soyla Dryer, NP Electronically Signed   By: Aletta Edouard M.D.   On: 03/07/2021 14:50   DG Chest Portable 1 View  Result Date: 03/06/2021 CLINICAL DATA:  70 year old male with pancreatic cancer, found down. Lethargy. EXAM: PORTABLE CHEST 1 VIEW COMPARISON:  CT chest 10/28/2020. FINDINGS: Portable AP upright view at 0853 hours. New right chest Port-A-Cath. Normal cardiac size and mediastinal contours. Visualized tracheal air column is within normal limits. Allowing for portable technique the lungs are clear. No pneumothorax. No acute osseous abnormality identified. Stable cholecystectomy clips. Paucity of bowel gas in the upper abdomen. IMPRESSION:  Negative portable chest with right side Port-A-Cath in place. Electronically Signed   By: Genevie Ann M.D.   On: 03/06/2021 09:07   CT Angio Abd/Pel w/ and/or w/o  Result Date: 03/06/2021 CLINICAL DATA:  70 year old with suspected abdominal mass. History of pancreatic cancer. EXAM: CTA ABDOMEN AND PELVIS WITH CONTRAST TECHNIQUE: Multidetector CT imaging of the abdomen and pelvis was performed using the standard protocol during bolus administration of intravenous contrast. Multiplanar reconstructed images and MIPs were obtained and reviewed to evaluate the vascular anatomy. CONTRAST:  19m OMNIPAQUE IOHEXOL 350 MG/ML SOLN COMPARISON:  [facility] [facility] 10[facility]28/2020 FINDINGS: VASCULAR Aorta: Bifurcated aortic stent graft extends into the bilateral iliac arteries. Aortic stent below the SMA. Small amount of mural thrombus within the main aortic graft body but no significant stenosis. Abdominal aortic aneurysm roughly measures 2.5 x 2.6 cm on sequence 5 5, image 144. Limited evaluation for an endoleak due to the study technique. No evidence for aortic rupture. Celiac: Main celiac trunk is patent and  small. Main branches coming off the main celiac trunk are the common hepatic artery and the splenic artery. The splenic artery has a very beaded configuration and could represent fibromuscular dysplasia. The left gastric artery originates just proximal to the celiac trunk and there is a replaced left hepatic artery. SMA: SMA is patent with a replaced right hepatic artery. Renals: Left renal artery is widely patent without aneurysm, dissection or significant stenosis. Right renal artery is patent but difficult to exclude mild narrowing near the origin. In addition, there is a small accessory right renal artery supplying the lower pole that is likely excluded from the aortic stent graft with retrograde flow. IMA: Origin is occluded due to the aortic stent graft. There is reconstitution of IMA branches and limited evaluation for endoleak. Inflow: Bifurcated aortic stent graft extends into the bilateral iliac arteries. Left-sided stents terminate near the distal left common iliac artery. The distal left common iliac artery measures up to 2.3 cm without a significant aneurysm sac. Patient has stents involving the right external and right internal iliac artery. Bilateral internal iliac arteries are patent. There appears to be a saccular type aneurysm involving the left external iliac artery that roughly measures 1 cm on sequence 5, image 178. Atherosclerotic plaque and stenosis in the distal right external iliac artery. Proximal Outflow: Limited evaluation of the proximal outflow vessels due to timing of the examination. There appears to be bypass graft in the left groin. Limited evaluation of this left femoral bypass. Atherosclerotic plaque involving the right common femoral artery. Veins: Limited evaluation due to the phase of contrast. Review of the MIP images confirms the above findings. NON-VASCULAR Lower chest: Tiny nodules in the right lower lobe were probably present on the exam from 10/28/2020. Small amount of  dependent atelectasis in the right lower lobe. No large pleural effusions. Underlying emphysematous changes in the lungs. Hepatobiliary: Large amount of perihepatic ascites. There is a metallic biliary stent with pneumobilia. Limited evaluation for hepatic lesions to the phase of contrast but there is subtle low-density in left hepatic lobe near segment 2 and segment 4A that measures roughly 3.7 cm. Based on the previous [facility] report, patient had lesion in this area. Difficult to exclude interval enlargement based on these images. Pancreas: Limited evaluation due to the phase of contrast. However, there are at least 3 cystic areas near the pancreatic tail region that are most likely related to the known mass in this area.  Largest cystic area measures up to 3.4 cm on sequence 12, image 6. The largest cystic area is probably abutting or involving the posterior gastric wall. Spleen: Spleen size is within normal limits. Adrenals/Urinary Tract: Limited evaluation of the adrenal glands. Atrophy or old infarct in the inferior right kidney related to the excluded right accessory renal artery. Stomach/Bowel: No significant dilatation of bowel structures. Very limited evaluation for bowel inflammation due to phase of contrast. Cystic lesion near the pancreatic tail appears to be involving or abutting the posterior wall the stomach. Lymphatic: Soft tissue fullness in the left periaortic region that could represent lymphadenopathy but poorly characterized. Abnormal stranding or soft tissue in the upper abdomen near the pancreas Reproductive: Limited evaluation of the prostate. Large fluid collection on the right side of scrotum could represent a large hydrocele. Other: Large amount of ascites in the abdomen and pelvis. Limited evaluation for peritoneal or omental disease on this examination. Musculoskeletal: No acute bone abnormality. IMPRESSION: VASCULAR 1. Bifurcated aortic stent graft is patent. Limited evaluation of the  aneurysm sac and limited evaluation for an endoleak. 2. 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 Variant celiac artery anatomy as described. 3. Atrophy or scarring involving the right kidney lower pole likely related to the excluded accessory right renal artery. 4. 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 NON-VASCULAR 1. Large amount of abdominal and pelvic ascites. This appears to be a new finding based on the previous report. 2. Limited evaluation of the patient's known pancreatic mass. Poorly visualized cystic lesions along the posterior wall the stomach and near the pancreatic tail likely represent the known pancreatic lesion. In addition there, is abnormal soft tissue near the celiac trunk and periaortic regions that could represent lymphadenopathy. Difficult to exclude omental thickening on this examination. 3. Metallic biliary stent with pneumobilia. 4. Probable low-density lesion in left hepatic lobe which corresponds with the previous report from [facility]. This area is poorly defined but may have enlarged since the previous examination. Findings are worrisome for progression of disease. 5. Probable right hydrocele. Electronically Signed   By: Markus Daft M.D.   On: 03/06/2021 13:09    PERFORMANCE STATUS (ECOG) : 3 - Symptomatic, >50% confined to bed  Review of Systems Unless otherwise noted, a complete review of systems is negative.  Physical Exam General: NAD, emaciated with temporal wasting Pulmonary: Unlabored Abdomen: Distended, positive fluid wave GU: no suprapubic tenderness Extremities: no edema, no joint deformities Skin: no rashes Neurological: Weakness but otherwise nonfocal  IMPRESSION: I met with patient to discuss goals.  Patient is noted to have waxing and waning mental status throughout this hospitalization.  However, presently he was alert and oriented.  Patient is able to relate to me an understanding that he has  metastatic pancreatic cancer on treatment at [facility].  Patient says that treatment was not going well as it was "started and stopped."  Patient was not able to verbalize an understanding of his prognosis.  We discussed today the aggressiveness of pancreatic cancer.  His life expectancy is likely measured in months, even with treatment. Patient has had progressive weakness and it seems unlikely that he would be a candidate for further systemic cancer treatment with declining performance status.  Patient is unsure if he would want more treatment.  We talked about the option of hospice care in detail.  He is undecided.  I did reach out to his oncology team at [facility] and left a voicemail asking to  discuss the plan.  I discussed CODE STATUS with patient.  He stated clearly that he would not want to be resuscitated or to have his life prolonged artificially on machines.  He said when it is time "just let me go."  He wanted to make sure family knew that he wanted to be buried and not cremated.  I called and spoke with his uncle.  His uncle was concerned that patient is declining and could be approaching end-of-life.  We discussed the option of hospice involvement.  However, it is not clear that patient is yet clinically appropriate for hospice inpatient unit.  I would be concerned that hospice would not be enough care should patient return to the boardinghouse.  Alternatively, it may be more appropriate to consider SNF with palliative care following and future transition to hospice when clinically appropriate.  Patient's uncle also verbalized agreement with DNR/DNI.  Patient has had persistent upper abdominal pain for which he has been receiving alternating oxycodone and Toradol.  Patient was previously on transdermal fentanyl but this was discontinued at time of admission due to altered mental status.  Generally, fentanyl is better tolerated with hepatic insufficiency versus other nonsynthetic opioids.  A retrial of  transdermal fentanyl at a lower dose could potentially better control patient's pain versus relying on frequent dosing of short acting oxycodone.  Patient's abdomen seems moderately distended with positive fluid wave.  He is status post paracentesis on 4/1 with 700 mL removed.  Consider repeat paracentesis.  If ascites has reaccumulated, this might also be exacerbating his pain.  PLAN: -Continue current scope of treatment -Consider retrying transdermal fentanyl at 25 mcg every 72 hours -Continue oxycodone/Toradol -Consider repeating ultrasound/paracentesis -I have called and left a message with [facility] oncology team -Probable dispo: SNF with palliative care and future hospice -DNR/DNI  Case and plan discussed with Dr. Rogue Bussing   Time Total: 60 minutes  Visit consisted of counseling and education dealing with the complex and emotionally intense issues of symptom management and palliative care in the setting of serious and potentially life-threatening illness.Greater than 50%  of this time was spent counseling and coordinating care related to the above assessment and plan.  Signed by: Altha Harm, PhD, NP-C

## 2021-03-10 NOTE — Progress Notes (Signed)
Physical Therapy Treatment Patient Details Name: Clayton Gill MRN: [id number] DOB: 03-Nov-1951 Today's Date: 03/10/2021    History of Present Illness Pt is a 70 y.o. male with medical history significant for recently diagnosed pancreatic cancer on chemotherapy, biliary obstruction status post placement of biliary stent, hypertension, hyperlipidemia, chronic hepatitis C, AAA status post repair, who is admitted to [facility] on 03/06/2021 with acute metabolic encephalopathy after presenting from home to [facility] ED for evaluation of altered mental status. MD assessment includes: Acute metabolic encephalopathy, AKI, Hypomagnesemia, Elevated troponin (asymptomatic without any chest pain, likely due to demand ischemia), abdominal swelling likely with US guided paracentesis ordered, and HTN.    PT Comments    Patient received in bed, reports he is feeling lousy. Agreeable to PT session. Flat affect. Increased time and effort needed for all activities. Patient requires min assist for supine to sit. Min assist for sit to stand. Ambulated 45 feet in room with RW and min guard to supervision. Performed seated and standing exercises. Limited by fatigue and weakness. Patient will continue to benefit from skilled PT while here to improve strength and functional independence. If patient has assistance at home he would be able to return home.     Follow Up Recommendations  SNF;Supervision for mobility/OOB     Equipment Recommendations  Rolling walker with 5" wheels;3in1 (PT)    Recommendations for Other Services       Precautions / Restrictions Precautions Precautions: Fall Restrictions Weight Bearing Restrictions: No    Mobility  Bed Mobility Overal bed mobility: Needs Assistance Bed Mobility: Supine to Sit;Sit to Supine     Supine to sit: Min assist Sit to supine: Supervision   General bed mobility comments: min assist for raising trunk to seated position.     Transfers Overall transfer level: Needs assistance Equipment used: Rolling walker (2 wheeled) Transfers: Sit to/from Stand Sit to Stand: Min assist;From elevated surface         General transfer comment: Extra time, effort needed to stand, requires bed elevation and min assist.  Ambulation/Gait Ambulation/Gait assistance: Min guard;Supervision Gait Distance (Feet): 45 Feet Assistive device: Rolling walker (2 wheeled) Gait Pattern/deviations: Step-through pattern;Decreased step length - right;Decreased step length - left;Decreased stride length Gait velocity: decreased   General Gait Details: Very slow cadence with short B step length with pt able to amb a max of 45'; mod lean on the RW for support but no LOB   Stairs             Wheelchair Mobility    Modified Rankin (Stroke Patients Only)       Balance Overall balance assessment: Needs assistance Sitting-balance support: Feet supported Sitting balance-Leahy Scale: Good     Standing balance support: Bilateral upper extremity supported;During functional activity Standing balance-Leahy Scale: Fair Standing balance comment: mod UE support on RW, supervision to min guard for mobility                            Cognition Arousal/Alertness: Lethargic Behavior During Therapy: Flat affect Overall Cognitive Status: Within Functional Limits for tasks assessed                                 General Comments: Patient pleasant with flat affect, but cooperative      Exercises Other Exercises Other Exercises: Seated B LE: LAQ, Standing Heel raises, marching x 10-15  reps each    General Comments        Pertinent Vitals/Pain Pain Assessment: Faces Faces Pain Scale: Hurts little more Pain Location: abdominal pain Pain Descriptors / Indicators: Discomfort Pain Intervention(s): Patient requesting pain meds-RN notified    Home Living                      Prior Function             PT Goals (current goals can now be found in the care plan section) Acute Rehab PT Goals Patient Stated Goal: To get stronger, go home PT Goal Formulation: With patient Time For Goal Achievement: 03/10/2021 Potential to Achieve Goals: Fair Progress towards PT goals: Progressing toward goals    Frequency    Min 2X/week      PT Plan Current plan remains appropriate    Co-evaluation              AM-PAC PT "6 Clicks" Mobility   Outcome Measure  Help needed turning from your back to your side while in a flat bed without using bedrails?: A Little Help needed moving from lying on your back to sitting on the side of a flat bed without using bedrails?: A Little Help needed moving to and from a bed to a chair (including a wheelchair)?: A Little Help needed standing up from a chair using your arms (e.g., wheelchair or bedside chair)?: A Little Help needed to walk in hospital room?: A Little Help needed climbing 3-5 steps with a railing? : A Lot 6 Click Score: 17    End of Session Equipment Utilized During Treatment: Gait belt Activity Tolerance: Patient limited by fatigue;Patient limited by lethargy Patient left: in bed;with call bell/phone within reach;with bed alarm set Nurse Communication: Mobility status;Patient requests pain meds PT Visit Diagnosis: Muscle weakness (generalized) (M62.81);Difficulty in walking, not elsewhere classified (R26.2)     Time: 1157-2620 PT Time Calculation (min) (ACUTE ONLY): 29 min  Charges:  $Gait Training: 8-22 mins $Therapeutic Exercise: 8-22 mins                     [employer], PT, GCS 03/10/21,2:28 PM

## 2021-03-11 ENCOUNTER — Inpatient Hospital Stay: Payer: Medicare HMO

## 2021-03-11 DIAGNOSIS — Z515 Encounter for palliative care: Secondary | ICD-10-CM | POA: Diagnosis not present

## 2021-03-11 DIAGNOSIS — G9341 Metabolic encephalopathy: Secondary | ICD-10-CM | POA: Diagnosis not present

## 2021-03-11 LAB — CULTURE, BLOOD (ROUTINE X 2)
Culture: NO GROWTH
Culture: NO GROWTH
Special Requests: ADEQUATE
Special Requests: ADEQUATE

## 2021-03-11 LAB — BODY FLUID CULTURE W GRAM STAIN: Culture: NO GROWTH

## 2021-03-11 LAB — COMPREHENSIVE METABOLIC PANEL
ALT: 119 U/L — ABNORMAL HIGH (ref 0–44)
AST: 109 U/L — ABNORMAL HIGH (ref 15–41)
Albumin: 2.4 g/dL — ABNORMAL LOW (ref 3.5–5.0)
Alkaline Phosphatase: 609 U/L — ABNORMAL HIGH (ref 38–126)
Anion gap: 8 (ref 5–15)
BUN: 45 mg/dL — ABNORMAL HIGH (ref 8–23)
CO2: 24 mmol/L (ref 22–32)
Calcium: 8.5 mg/dL — ABNORMAL LOW (ref 8.9–10.3)
Chloride: 108 mmol/L (ref 98–111)
Creatinine, Ser: 1.21 mg/dL (ref 0.61–1.24)
GFR, Estimated: >60 mL/min (ref >60)
Glucose, Bld: 116 mg/dL — ABNORMAL HIGH (ref 70–99)
Potassium: 4.3 mmol/L (ref 3.5–5.1)
Sodium: 140 mmol/L (ref 135–145)
Total Bilirubin: 2.1 mg/dL — ABNORMAL HIGH (ref 0.3–1.2)
Total Protein: 6.2 g/dL — ABNORMAL LOW (ref 6.5–8.1)

## 2021-03-11 LAB — CBC
HCT: 33.4 % — ABNORMAL LOW (ref 39.0–52.0)
Hemoglobin: 11.2 g/dL — ABNORMAL LOW (ref 13.0–17.0)
MCH: 31.3 pg (ref 26.0–34.0)
MCHC: 33.5 g/dL (ref 30.0–36.0)
MCV: 93.3 fL (ref 80.0–100.0)
Platelets: 56 10*3/uL — ABNORMAL LOW (ref 150–400)
RBC: 3.58 MIL/uL — ABNORMAL LOW (ref 4.22–5.81)
RDW: 15.6 % — ABNORMAL HIGH (ref 11.5–15.5)
WBC: 6.7 10*3/uL (ref 4.0–10.5)
nRBC: 0 % (ref 0.0–0.2)

## 2021-03-11 LAB — CYTOLOGY - NON PAP

## 2021-03-11 LAB — MAGNESIUM: Magnesium: 1.9 mg/dL (ref 1.7–2.4)

## 2021-03-11 LAB — METHYLMALONIC ACID, SERUM: Methylmalonic Acid, Quantitative: 146 nmol/L (ref 0–378)

## 2021-03-11 LAB — AMMONIA: Ammonia: 80 umol/L — ABNORMAL HIGH (ref 9–35)

## 2021-03-11 MED ORDER — NICOTINE 21 MG/24HR TD PT24: 21.0000 mg | MEDICATED_PATCH | Freq: Every day | TRANSDERMAL | Status: DC

## 2021-03-11 MED ORDER — LACTULOSE 10 GM/15ML PO SOLN
30.0000 g | Freq: Every day | ORAL | Status: DC
  Administered 2021-03-11 – 2021-03-13 (×3): 30 g via ORAL
  Filled 2021-03-11 (×3): qty 60

## 2021-03-11 MED ORDER — NICOTINE 21 MG/24HR TD PT24
21.0000 mg | MEDICATED_PATCH | Freq: Every day | TRANSDERMAL | Status: DC
  Administered 2021-03-11 – 2021-03-13 (×4): 21 mg via TRANSDERMAL
  Filled 2021-03-11 (×4): qty 1

## 2021-03-11 NOTE — Progress Notes (Signed)
PROGRESS NOTE   HPI was taken from Dr. Velia Gill: Clayton Gill is a 70 y.o. male with medical history significant for recently diagnosed pancreatic cancer on chemotherapy, biliary obstruction status post placement of biliary stent, hypertension, hyperlipidemia, chronic hepatitis C, AAA status post repair, who is admitted to [facility] on 03/06/2021 with acute metabolic encephalopathy after presenting from home to [facility] ED for evaluation of altered mental status.   In the context of the patient's altered mental status, his provision of history is limited at this time.  Additional history is obtained via discussions with the emergency department physician and via chart review.  The patient reports that he was diagnosed with pancreatic cancer approximately 3 weeks ago, with interval initiation of chemotherapy x 1 session, which he reports occurred 1 week ago.  The patient was reportedly scheduled for his next chemo therapy session today.  When the patient's neighbor, who was responsible for taking the patient to this chemotherapy session, arrived at the patient's home, he found the patient to be laying in bed and noted him to be somnolent, lethargic, and mildly confused relative to baseline, prompting the patient to be brought to [facility] ED for further evaluation of such.  The patient's friend also noted that the patient had fallen yesterday, although the details of this fall are unclear as it was unwitnessed by the patient's friend.  The patient does not recall the details relating to this fall, and is unsure if he lost consciousness or if he hit his head.  The patient reports recent development of mild abdominal swelling in the absence of any associated abdominal discomfort.  He notes new onset dysuria over the last few days, in the absence of any associated gross hematuria or change in urinary urgency/frequency.  Denies any chest pain, shortness of breath, palpitations,  paresthesias.  Denies any recent nausea/vomiting, but reports recent decline in oral intake in the setting of decline in appetite.  Denies any recent diarrhea.   Denies any recent subjective fever, chills, rigors, or generalized myalgias.  No recent headache, neck stiffness, cough.   Per chart review, it appears that the patient has a history of biliary obstruction, status post placement of metallic biliary stent.  Most recent prior liver enzymes, per chart review, were drawn on 02/19/2021 and were notable for the following: Albumin 2.7, alkaline phosphatase 769, AST 320, ALT 424, total bilirubin 4.7.  Per chart review, patient has a history of chronic hepatitis C, with most recent HCVRNA PCR showing a nondetectable viral load on 12/18/2020.  The patient reports that he he was treated with antiviral therapy as an outpatient, and conveys that he completed this therapy approximately 6 months ago.  Per chart review, it appears that the patient has previously followed with [facility], specifically with Dr. Vicente Gill.   Hospital Course from Dr. Jimmye Gill 4/2-03/10/21: Pt's mental status has waxed and waned throughout hospital stay. Pt has poor functional status and pt is very malnourished. Palliative care was consulted (Gill) and will see pt. PT/OT recs SNF but pt wants to go home instead and thinks he will be fine at home by himself. Reached out to pt's uncle on file but no answer so I left a message. 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)   Palliative care encounter   Acute metabolic  encephalopathy: labile.Likely due to pain medication and possibly due to hepatic etiology with elevated ammonia level.  Ammonia level up today, 80. We will start lactulose 30 g daily  Follow-up ammonia level daily   Hx of pancreatic cancer: stage IV w/ liver mets. Recently had chemo. CT scan concern for progressive disease. Oxycodone, toradol, & dilaudid prn. Very poor prognosis.  Palliative care input appreciated. Trying to find placement -home with hospice v.s. snf. Pt unable to take care of him self. Will discuss with family/uncle about goals of care and placement today. Will ask IR for therapeutic paracentesis  AKI: improved. cre 1.21 today. Avoid nephrotoxic meds   Hypomagnesemia: stable . 1.9 today  Elevated troponin: likely secondary to demand ischemia   HTN: BP is on low end of normal, continue to hold lisinopril, HCTZ.    Chronic tobacco abuse: smoking cessation counseling    Ascitis: s/p US paracentesis w/ 76mL of peritoneal fluid removed   Thrombocytopenia: possibly secondary to chemo No bleeding reported.  Continue to monitor.  Transaminitis: likely secondary to liver mets   Likely severe protein calorie malnutrition: Nutrition consulted   DVT prophylaxis: SCDs Code Status: full  Family Communication: none at bedside. Trying to get hold of uncle.Palliative d/w pt about hospice house, but he declined.  Disposition Plan: SNF vs HH   Level of care: Med-Surg   Status is: Inpatient  Remains inpatient appropriate because:Unsafe d/c plan, IV treatments appropriate due to intensity of illness or inability to take PO and Inpatient level of care appropriate due to severity of illness   Dispo: The patient is from: Home              Anticipated d/c is to: SNF              Patient currently is not medically stable to d/c.   Difficult to place patient Yes    Consultants:   Onco  Palliative care (Clayton Gill)   Procedures:    Antimicrobials:     Subjective: Denies abd pain, sob, cp.   Objective: Vitals:   03/11/21 0109 03/11/21 0510 03/11/21 0745 03/11/21 1116  BP: 111/86 106/84 104/78 109/78  Pulse: 80 80 79 76  Resp: 18 16 17 16   Temp: 98.3 F (36.8 C) 98.5 F (36.9 C) 97.8 F (36.6 C) 98.5 F (36.9 C)  TempSrc:      SpO2: 100% 100% 100% 96%  Weight:      Height:       No intake or output data in the 24 hours ending 03/11/21 1247 Filed Weights   03/07/21 0417 03/09/21 0649 03/10/21 0503  Weight: 56.5 kg 56.5 kg 54.2 kg    Examination: Cachetic, nad cta no w/r/r Regular s1/s2 Soft mildly distended NT No edema aaxox3    Data Reviewed: I have personally reviewed following labs and imaging studies  CBC: Recent Labs  Lab 03/06/21 0855 03/07/21 0522 03/08/21 0758 03/09/21 0442 03/10/21 0629 03/11/21 0450  WBC 4.6 5.4 5.4 4.8 11.9* 6.7  NEUTROABS 3.3  --   --   --   --   --   HGB 10.9* 10.9* 11.2* 10.1* 11.7* 11.2*  HCT 32.3* 32.1* 33.0* 30.1* 35.1* 33.4*  MCV 92.6 90.9 90.4 92.6 92.1 93.3  PLT 100* 101* 88* 73*  66* 56*   Basic Metabolic Panel: Recent Labs  Lab 03/06/21 0855 03/07/21 0522 03/08/21 0758 03/09/21 0442 03/10/21 0629 03/11/21 0450  NA 140 139 135 138 139 140  K 4.2 4.2 3.7 3.9 4.2 4.3  CL 107 107 104 108 108 108  CO2 25 25 22 24 24 24   GLUCOSE 72 84 103* 99 108* 116*  BUN 41* 53* 45* 39* 44* 45*  CREATININE 1.42* 1.36* 1.19 1.03 1.29* 1.21  CALCIUM 8.7* 8.8* 8.3* 8.1* 8.3* 8.5*  MG 1.6* 1.9  --  1.7 1.8 1.9   GFR: Estimated Creatinine Clearance: 44.8 mL/min (by C-G formula based on SCr of 1.21 mg/dL). Liver Function Tests: Recent Labs  Lab 03/06/21 0855 03/07/21 0522 03/09/21 0442 03/10/21 0629 03/11/21 0450  AST 59* 59* 103* 149* 109*  ALT 97* 94* 99* 130* 119*  ALKPHOS 513* 486* 530* 600* 609*  BILITOT 2.9* 2.6* 2.3* 2.4* 2.1*  PROT 6.8 6.4* 5.9* 6.2* 6.2*  ALBUMIN 2.8* 2.7* 2.4* 2.5* 2.4*   Recent Labs  Lab 03/06/21 0855  LIPASE 25   Recent Labs   Lab 03/06/21 0854 03/08/21 0758 03/09/21 0442 03/10/21 0629 03/11/21 0450  AMMONIA 112* 110* 108* 34 80*   Coagulation Profile: Recent Labs  Lab 03/06/21 0855 03/07/21 0522  INR 1.4* 1.6*   Cardiac Enzymes: Recent Labs  Lab 03/06/21 0855  CKTOTAL 129   BNP (last 3 results) No results for input(s): PROBNP in the last 8760 hours. HbA1C: No results for input(s): HGBA1C in the last 72 hours. CBG: Recent Labs  Lab 03/06/21 2228  GLUCAP 133*   Lipid Profile: No results for input(s): CHOL, HDL, LDLCALC, TRIG, CHOLHDL, LDLDIRECT in the last 72 hours. Thyroid Function Tests: No results for input(s): TSH, T4TOTAL, FREET4, T3FREE, THYROIDAB in the last 72 hours. Anemia Panel: No results for input(s): VITAMINB12, FOLATE, FERRITIN, TIBC, IRON, RETICCTPCT in the last 72 hours. Sepsis Labs: No results for input(s): PROCALCITON, LATICACIDVEN in the last 168 hours.  Recent Results (from the past 240 hour(s))  Resp Panel by RT-PCR (Flu A&B, Covid) Nasopharyngeal Swab     Status: None   Collection Time: 03/06/21  8:48 AM   Specimen: Nasopharyngeal Swab; Nasopharyngeal(NP) swabs in vial transport medium  Result Value Ref Range Status   SARS Coronavirus 2 by RT PCR NEGATIVE NEGATIVE Final    Comment: (NOTE) SARS-CoV-2 target nucleic acids are NOT DETECTED.  The SARS-CoV-2 RNA is generally detectable in upper respiratory specimens during the acute phase of infection. The lowest concentration of SARS-CoV-2 viral copies this assay can detect is 138 copies/mL. A negative result does not preclude SARS-Cov-2 infection and should not be used as the sole basis for treatment or other patient management decisions. A negative result may occur with  improper specimen collection/handling, submission of specimen other than nasopharyngeal swab, presence of viral mutation(s) within the areas targeted by this assay, and inadequate number of viral copies(<138 copies/mL). A negative result must  be combined with clinical observations, patient history, and epidemiological information. The expected result is Negative.  Fact Sheet for Patients:  EntrepreneurPulse.com.au  Fact Sheet for Healthcare Providers:  IncredibleEmployment.be  This test is no t yet approved or cleared by the [place] FDA and  has been authorized for detection and/or diagnosis of SARS-CoV-2 by FDA under an Emergency Use Authorization (EUA). This EUA will remain  in effect (meaning this test can be used) for the duration of the COVID-19 declaration under Section 564(b)(1) of the Act, 21 U.S.C.section 360bbb-3(b)(1),  unless the authorization is terminated  or revoked sooner.       Influenza A by PCR NEGATIVE NEGATIVE Final   Influenza B by PCR NEGATIVE NEGATIVE Final    Comment: (NOTE) The Xpert Xpress SARS-CoV-2/FLU/RSV plus assay is intended as an aid in the diagnosis of influenza from Nasopharyngeal swab specimens and should not be used as a sole basis for treatment. Nasal washings and aspirates are unacceptable for Xpert Xpress SARS-CoV-2/FLU/RSV testing.  Fact Sheet for Patients: EntrepreneurPulse.com.au  Fact Sheet for Healthcare Providers: IncredibleEmployment.be  This test is not yet approved or cleared by the [place] FDA and has been authorized for detection and/or diagnosis of SARS-CoV-2 by FDA under an Emergency Use Authorization (EUA). This EUA will remain in effect (meaning this test can be used) for the duration of the COVID-19 declaration under Section 564(b)(1) of the Act, 21 U.S.C. section 360bbb-3(b)(1), unless the authorization is terminated or revoked.  Performed at [facility], [address] [place], [place] [address]   Culture, blood (Routine X 2) w Reflex to ID Panel     Status: None   Collection Time: 03/06/21 11:29 PM   Specimen: BLOOD  Result Value Ref Range Status    Specimen Description BLOOD BLOOD RIGHT HAND  Final   Special Requests   Final    BOTTLES DRAWN AEROBIC AND ANAEROBIC Blood Culture adequate volume   Culture   Final    NO GROWTH 5 DAYS Performed at [facility], [address] [place], [place] [address]    Report Status 03/11/2021 FINAL  Final  Culture, blood (Routine X 2) w Reflex to ID Panel     Status: None   Collection Time: 03/06/21 11:29 PM   Specimen: BLOOD  Result Value Ref Range Status   Specimen Description BLOOD BLOOD LEFT HAND  Final   Special Requests IN PEDIATRIC BOTTLE Blood Culture adequate volume  Final   Culture   Final    NO GROWTH 5 DAYS Performed at [facility], [address], [place], [place] [address]    Report Status 03/11/2021 FINAL  Final  Body fluid culture w Gram Stain     Status: None   Collection Time: 03/07/21  1:45 PM   Specimen: PATH Cytology Peritoneal fluid  Result Value Ref Range Status   Specimen Description   Final    PERITONEAL Performed at [facility], [address], [place], [place] [address]    Special Requests   Final    NONE Performed at [facility], [address] [place], [place] [address]    Gram Stain   Final    RARE WBC PRESENT, PREDOMINANTLY MONONUCLEAR NO ORGANISMS SEEN    Culture   Final    NO GROWTH Performed at [facility], [place] [address], [place], [place] [address]    Report Status 03/11/2021 FINAL  Final         Radiology Studies: No results found.      Scheduled Meds: . Chlorhexidine Gluconate Cloth  6 each Topical Daily  . feeding supplement  237 mL Oral TID BM  . lactulose  20 g Oral Once  . nicotine  21 mg Transdermal Daily   Continuous Infusions: . ondansetron (ZOFRAN) IV       LOS: 5 days    Time spent: 35 minutes with more than 50% on [place], MD [facility] Pager [phone]  If 7PM-7AM, please contact night-coverage 03/11/2021, 12:47 PM

## 2021-03-11 NOTE — TOC Progression Note (Addendum)
Transition of Care [facility]) - Progression Note    Patient Details  Name: Clayton Gill MRN: [id number] Date of Birth: November 13, 1951  Transition of Care [facility]) CM/SW [place], RN Phone Number: 03/11/2021, 9:38 AM  Clinical Narrative:   Addendum to note 4:05pm:  Uncle returned call to [facility], stated that he is not comfortable sending the patient home, and he is planning to call patient later this afternoon to discuss hospice with patient.  He will contact TOC following call today or early tomorrow with a summary of the discussion.  TOC contact information given.  TOC to follow through discharge.  TOC in to see patient, patient sleeping.  TOC contacted uncle via phone re:  Bed offers from [place], [facility], [place] and [place].  Left voice mail for uncle, awaiting response.         Expected Discharge Plan and Services                                                 Social Determinants of Health (SDOH) Interventions    Readmission Risk Interventions No flowsheet data found.

## 2021-03-11 NOTE — Procedures (Signed)
Interventional Radiology Procedure Note  Procedure: LV paracnetesis    Complications: None  Estimated Blood Loss:  0  Findings: Full report in pacs     Tamera Punt, MD

## 2021-03-11 NOTE — Progress Notes (Signed)
PT Cancellation Note  Patient Details Name: Clayton Gill MRN: [id number] DOB: 1951/10/04   Cancelled Treatment:    Reason Eval/Treat Not Completed: Patient at procedure or test/unavailable.  Chart reviewed.  Pt currently not in room (per chart pt off floor for procedure).  Will re-attempt PT session at a later date/time.  Leitha Bleak, PT 03/11/21, 3:04 PM

## 2021-03-11 NOTE — Progress Notes (Signed)
Clayton Gill  [phone] [phone]   Name: Clayton Gill Date: 03/11/2021 MRN: [id number]  DOB: [birth year]  Patient Care Team: Theotis Burrow, MD as PCP - General (Family Medicine)    REASON FOR CONSULTATION: Clayton Gill is a 70 y.o. 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  Patient has had ongoing pain.  Palliative care was consulted up address goals and manage ongoing symptoms. .    CODE STATUS: DNR  PAST MEDICAL HISTORY: Past Medical History:  Diagnosis Date  . Chronic hepatitis C ([place])   . GERD (gastroesophageal reflux disease)   . High cholesterol   . Hypertension   . Rectal bleeding     PAST SURGICAL HISTORY:  Past Surgical History:  Procedure Laterality Date  . ABDOMINAL AORTIC ANEURYSM REPAIR    . CHOLECYSTECTOMY    . COLONOSCOPY    . COLONOSCOPY WITH PROPOFOL N/A 08/20/2016   Procedure: COLONOSCOPY WITH PROPOFOL;  Surgeon: Lollie Sails, MD;  Location: [facility] ENDOSCOPY;  Service: Endoscopy;  Laterality: N/A;  . COLONOSCOPY WITH PROPOFOL N/A 11/06/2019   Procedure: COLONOSCOPY WITH PROPOFOL;  Surgeon: Jonathon Bellows, MD;  Location: [facility] ENDOSCOPY;  Service: Gastroenterology;  Laterality: N/A;  . HEMORRHOIDECTOMY WITH HEMORRHOID BANDING    . HEMORROIDECTOMY    . LOWER EXTREMITY ANGIOGRAPHY Right 12/11/2019   Procedure: LOWER EXTREMITY ANGIOGRAPHY;  Surgeon: Algernon Huxley, MD;  Location: [place] CV LAB;  Service: Cardiovascular;  Laterality: Right;  . LOWER EXTREMITY ANGIOGRAPHY Right 12/21/2019   Procedure: LOWER EXTREMITY ANGIOGRAPHY;  Surgeon: Algernon Huxley, MD;  Location: [place] CV LAB;  Service:  Cardiovascular;  Laterality: Right;  . surgery left leg      HEMATOLOGY/ONCOLOGY HISTORY:  Oncology History   No history exists.    ALLERGIES:  has No Known Allergies.  MEDICATIONS:  Current Facility-Administered Medications  Medication Dose Route Frequency Provider Last Rate Last Admin  . acetaminophen (TYLENOL) tablet 650 mg  650 mg Oral Q6H PRN Howerter, Justin B, DO   650 mg at 03/08/21 0304   Or  . acetaminophen (TYLENOL) suppository 650 mg  650 mg Rectal Q6H PRN Howerter, Justin B, DO      . Chlorhexidine Gluconate Cloth 2 % PADS 6 each  6 each Topical Daily Nolberto Hanlon, MD   6 each at 03/11/21 0941  . feeding supplement (ENSURE ENLIVE / ENSURE PLUS) liquid 237 mL  237 mL Oral TID BM Wyvonnia Dusky, MD   237 mL at 03/11/21 0941  . HYDROmorphone (DILAUDID) injection 1 mg  1 mg Intravenous Q4H PRN Wyvonnia Dusky, MD      . ketorolac (TORADOL) 15 MG/ML injection 15 mg  15 mg Intravenous Q8H PRN Wyvonnia Dusky, MD   15 mg at 03/11/21 0940  . lactulose (CHRONULAC) 10 GM/15ML solution 30 g  30 g Oral Daily Nolberto Hanlon, MD      . naloxone [facility]) injection 0.4 mg  0.4 mg Intravenous PRN Howerter, Justin B, DO   0.4 mg at 03/06/21 2343  . nicotine (NICODERM CQ - dosed in mg/24 hours) patch 21 mg  21 mg Transdermal Daily Renda Rolls, RPH   21 mg at 03/11/21 0443  . ondansetron (ZOFRAN) 8 mg in sodium  chloride 0.9 % 50 mL IVPB  8 mg Intravenous Q8H PRN Wyvonnia Dusky, MD      . oxyCODONE (Oxy IR/ROXICODONE) immediate release tablet 20 mg  20 mg Oral Q4H PRN Wyvonnia Dusky, MD   20 mg at 03/11/21 0553    VITAL SIGNS: BP 109/78 (BP Location: Right Arm)   Pulse 76   Temp 98.5 F (36.9 C)   Resp 16   Ht _0  (1.854 m)   Wt 119 lb 7.8 oz (54.2 kg)   SpO2 96%   BMI 15.76 kg/m  Filed Weights   03/07/21 0417 03/09/21 0649 03/10/21 0503  Weight: 124 lb 9 oz (56.5 kg) 124 lb 9 oz (56.5 kg) 119 lb 7.8 oz (54.2 kg)    Estimated body mass index is 15.76  kg/m as calculated from the following:   Height as of this encounter: _1  (1.854 m).   Weight as of this encounter: 119 lb 7.8 oz (54.2 kg).  LABS: CBC:    Component Value Date/Time   WBC 6.7 03/11/2021 0450   HGB 11.2 (L) 03/11/2021 0450   HGB 13.6 06/05/2020 1007   HCT 33.4 (L) 03/11/2021 0450   HCT 40.5 06/05/2020 1007   PLT 56 (L) 03/11/2021 0450   MCV 93.3 03/11/2021 0450   MCV 90 06/05/2020 1007   NEUTROABS 3.3 03/06/2021 0855   NEUTROABS 2.8 06/05/2020 1007   LYMPHSABS 0.9 03/06/2021 0855   LYMPHSABS 1.7 06/05/2020 1007   MONOABS 0.4 03/06/2021 0855   EOSABS 0.0 03/06/2021 0855   EOSABS 0.1 06/05/2020 1007   BASOSABS 0.0 03/06/2021 0855   BASOSABS 0.0 06/05/2020 1007   Comprehensive Metabolic Panel:    Component Value Date/Time   NA 140 03/11/2021 0450   NA 139 04/30/2020 1340   K 4.3 03/11/2021 0450   CL 108 03/11/2021 0450   CO2 24 03/11/2021 0450   BUN 45 (H) 03/11/2021 0450   BUN 12 04/30/2020 1340   CREATININE 1.21 03/11/2021 0450   GLUCOSE 116 (H) 03/11/2021 0450   CALCIUM 8.5 (L) 03/11/2021 0450   AST 109 (H) 03/11/2021 0450   ALT 119 (H) 03/11/2021 0450   ALKPHOS 609 (H) 03/11/2021 0450   BILITOT 2.1 (H) 03/11/2021 0450   BILITOT 0.4 04/30/2020 1340   PROT 6.2 (L) 03/11/2021 0450   PROT 7.8 04/30/2020 1340   ALBUMIN 2.4 (L) 03/11/2021 0450   ALBUMIN 4.3 04/30/2020 1340    RADIOGRAPHIC STUDIES: CT Head Wo Contrast  Result Date: 03/06/2021 CLINICAL DATA:  Found on floor, fall, pancreatic cancer EXAM: CT HEAD WITHOUT CONTRAST CT CERVICAL SPINE WITHOUT CONTRAST TECHNIQUE: Multidetector CT imaging of the head and cervical spine was performed following the standard protocol without intravenous contrast. Multiplanar CT image reconstructions of the cervical spine were also generated. COMPARISON:  None. FINDINGS: CT HEAD FINDINGS Brain: No evidence of acute infarction, hemorrhage, hydrocephalus, extra-axial collection or mass lesion/mass effect.  Periventricular and deep white matter hypodensity 3 Vascular: No hyperdense vessel or unexpected calcification. Skull: Normal. Negative for fracture or focal lesion. Sinuses/Orbits: No acute finding. Other: None. CT CERVICAL SPINE FINDINGS Alignment: Normal. Skull base and vertebrae: No acute fracture. No primary bone lesion or focal pathologic process. Soft tissues and spinal canal: No prevertebral fluid or swelling. No visible canal hematoma. Disc levels: Intact. Moderate multilevel bilateral facet degenerative change. Upper chest: Negative. Other: None. IMPRESSION: 1. No acute intracranial pathology. Small-vessel white matter disease. 2. No fracture or static subluxation of the cervical spine. Moderate multilevel  bilateral facet degenerative change. Electronically Signed   By: Eddie Candle M.D.   On: 03/06/2021 12:40   CT Cervical Spine Wo Contrast  Result Date: 03/06/2021 CLINICAL DATA:  Found on floor, fall, pancreatic cancer EXAM: CT HEAD WITHOUT CONTRAST CT CERVICAL SPINE WITHOUT CONTRAST TECHNIQUE: Multidetector CT imaging of the head and cervical spine was performed following the standard protocol without intravenous contrast. Multiplanar CT image reconstructions of the cervical spine were also generated. COMPARISON:  None. FINDINGS: CT HEAD FINDINGS Brain: No evidence of acute infarction, hemorrhage, hydrocephalus, extra-axial collection or mass lesion/mass effect. Periventricular and deep white matter hypodensity 3 Vascular: No hyperdense vessel or unexpected calcification. Skull: Normal. Negative for fracture or focal lesion. Sinuses/Orbits: No acute finding. Other: None. CT CERVICAL SPINE FINDINGS Alignment: Normal. Skull base and vertebrae: No acute fracture. No primary bone lesion or focal pathologic process. Soft tissues and spinal canal: No prevertebral fluid or swelling. No visible canal hematoma. Disc levels: Intact. Moderate multilevel bilateral facet degenerative change. Upper chest:  Negative. Other: None. IMPRESSION: 1. No acute intracranial pathology. Small-vessel white matter disease. 2. No fracture or static subluxation of the cervical spine. Moderate multilevel bilateral facet degenerative change. Electronically Signed   By: Eddie Candle M.D.   On: 03/06/2021 12:40   US Paracentesis  Result Date: 03/07/2021 INDICATION: Patient with recently diagnosed pancreatic cancer presents today for a diagnostic and therapeutic paracentesis. EXAM: ULTRASOUND GUIDED PARACENTESIS MEDICATIONS: 1% lidocaine 10 mL COMPLICATIONS: None immediate. PROCEDURE: Informed written consent was obtained from the patient after a discussion of the risks, benefits and alternatives to treatment. A timeout was performed prior to the initiation of the procedure. Initial ultrasound scanning demonstrates a large amount of ascites within the right lower abdominal quadrant. The right lower abdomen was prepped and draped in the usual sterile fashion. 1% lidocaine was used for local anesthesia. Following this, a 6 Fr Safe-T-Centesis catheter was introduced. An ultrasound image was saved for documentation purposes. The paracentesis was performed. The catheter was removed and a dressing was applied. The patient tolerated the procedure well without immediate post procedural complication. FINDINGS: A total of approximately 700 mL of clear yellow fluid was removed. Samples were sent to the laboratory as requested by the clinical team. IMPRESSION: Successful ultrasound-guided paracentesis yielding 700 mL of peritoneal fluid. Read by: Soyla Dryer, NP Electronically Signed   By: Aletta Edouard M.D.   On: 03/07/2021 14:50   DG Chest Portable 1 View  Result Date: 03/06/2021 CLINICAL DATA:  70 year old male with pancreatic cancer, found down. Lethargy. EXAM: PORTABLE CHEST 1 VIEW COMPARISON:  CT chest 10/28/2020. FINDINGS: Portable AP upright view at 0853 hours. New right chest Port-A-Cath. Normal cardiac size and mediastinal  contours. Visualized tracheal air column is within normal limits. Allowing for portable technique the lungs are clear. No pneumothorax. No acute osseous abnormality identified. Stable cholecystectomy clips. Paucity of bowel gas in the upper abdomen. IMPRESSION: Negative portable chest with right side Port-A-Cath in place. Electronically Signed   By: Genevie Ann M.D.   On: 03/06/2021 09:07   CT Angio Abd/Pel w/ and/or w/o  Result Date: 03/06/2021 CLINICAL DATA:  70 year old with suspected abdominal mass. History of pancreatic cancer. EXAM: CTA ABDOMEN AND PELVIS WITH CONTRAST TECHNIQUE: Multidetector CT imaging of the abdomen and pelvis was performed using the standard protocol during bolus administration of intravenous contrast. Multiplanar reconstructed images and MIPs were obtained and reviewed to evaluate the vascular anatomy. CONTRAST:  51m OMNIPAQUE IOHEXOL 350 MG/ML SOLN COMPARISON:  CT report  from 12/17/2020 from [facility]. 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 Aortic stent below the SMA. Small amount of mural thrombus within the main aortic graft body but no significant stenosis. Abdominal aortic aneurysm roughly measures 2.5 x 2.6 cm on sequence 5 5, image 144. Limited evaluation for an endoleak due to the study technique. No evidence for aortic rupture. Celiac: Main celiac trunk is patent and small. Main branches coming off the main celiac trunk are the common hepatic artery and the splenic artery. The splenic artery has a very beaded configuration and could represent fibromuscular dysplasia. The left gastric artery originates just proximal to the celiac trunk and there is a replaced left hepatic artery. SMA: SMA is patent with a replaced right hepatic artery. Renals: Left renal artery is widely patent without aneurysm, dissection or significant stenosis. Right renal artery is patent but difficult to exclude mild  narrowing near the origin. In addition, there is a small accessory right renal artery supplying the lower pole that is likely excluded from the aortic stent graft with retrograde flow. IMA: Origin is occluded due to the aortic stent graft. There is reconstitution of IMA branches and limited evaluation for endoleak. Inflow: Bifurcated aortic stent graft extends into the bilateral iliac arteries. Left-sided stents terminate near the distal left common iliac artery. The distal left common iliac artery measures up to 2.3 cm without a significant aneurysm sac. Patient has stents involving the right external and right internal iliac artery. Bilateral internal iliac arteries are patent. There appears to be a saccular type aneurysm involving the left external iliac artery that roughly measures 1 cm on sequence 5, image 178. Atherosclerotic plaque and stenosis in the distal right external iliac artery. Proximal Outflow: Limited evaluation of the proximal outflow vessels due to timing of the examination. There appears to be bypass graft in the left groin. Limited evaluation of this left femoral bypass. Atherosclerotic plaque involving the right common femoral artery. Veins: Limited evaluation due to the phase of contrast. Review of the MIP images confirms the above findings. NON-VASCULAR Lower chest: Tiny nodules in the right lower lobe were probably present on the exam from 10/28/2020. Small amount of dependent atelectasis in the right lower lobe. No large pleural effusions. Underlying emphysematous changes in the lungs. Hepatobiliary: Large amount of perihepatic ascites. There is a metallic biliary stent with pneumobilia. Limited evaluation for hepatic lesions to the phase of contrast but there is subtle low-density in left hepatic lobe near segment 2 and segment 4A that measures roughly 3.7 cm. Based on the previous [facility] report, patient had lesion in this area. Difficult to exclude interval enlargement based on these  images. Pancreas: Limited evaluation due to the phase of contrast. However, there are at least 3 cystic areas near the pancreatic tail region that are most likely related to the known mass in this area. Largest cystic area measures up to 3.4 cm on sequence 12, image 6. The largest cystic area is probably abutting or involving the posterior gastric wall. Spleen: Spleen size is within normal limits. Adrenals/Urinary Tract: Limited evaluation of the adrenal glands. Atrophy or old infarct in the inferior right kidney related to the excluded right accessory renal artery. Stomach/Bowel: No significant dilatation of bowel structures. Very limited evaluation for bowel inflammation due to phase of contrast. Cystic lesion near the pancreatic tail appears to be involving or abutting the posterior wall the stomach. Lymphatic: Soft tissue fullness in the left periaortic  region that could represent lymphadenopathy but poorly characterized. Abnormal stranding or soft tissue in the upper abdomen near the pancreas Reproductive: Limited evaluation of the prostate. Large fluid collection on the right side of scrotum could represent a large hydrocele. Other: Large amount of ascites in the abdomen and pelvis. Limited evaluation for peritoneal or omental disease on this examination. Musculoskeletal: No acute bone abnormality. IMPRESSION: VASCULAR 1. Bifurcated aortic stent graft is patent. Limited evaluation of the aneurysm sac and limited evaluation for an endoleak. 2. 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 Variant celiac artery anatomy as described. 3. Atrophy or scarring involving the right kidney lower pole likely related to the excluded accessory right renal artery. 4. 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 NON-VASCULAR 1. Large amount of abdominal and pelvic ascites. This appears to be a new finding based on the previous report. 2. Limited evaluation of the  patient's known pancreatic mass. Poorly visualized cystic lesions along the posterior wall the stomach and near the pancreatic tail likely represent the known pancreatic lesion. In addition there, is abnormal soft tissue near the celiac trunk and periaortic regions that could represent lymphadenopathy. Difficult to exclude omental thickening on this examination. 3. Metallic biliary stent with pneumobilia. 4. Probable low-density lesion in left hepatic lobe which corresponds with the previous report from [facility]. This area is poorly defined but may have enlarged since the previous examination. Findings are worrisome for progression of disease. 5. Probable right hydrocele. Electronically Signed   By: Markus Daft M.D.   On: 03/06/2021 13:09    PERFORMANCE STATUS (ECOG) : 3 - Symptomatic, >50% confined to bed  Review of Systems Unless otherwise noted, a complete review of systems is negative.  Physical Exam General: NAD Pulmonary: unlabored Abdomen: distended GU: no suprapubic tenderness Extremities: no edema, no joint deformities Skin: no rashes Neurological: Weakness but otherwise nonfocal  IMPRESSION: Follow up visit.   I was able to speak with Alicia Amel, NP with [facility] oncology.  Given patient's decline, oncology is in full agreement with hospice.  Patient only received 1 cycle of chemotherapy and it resulted in a significant decline in performance status.  Patient poorly tolerated chemotherapy.  I met with patient to discuss goals.  I explained that patient's prognosis is extremely poor and I felt the patient would be best served in a hospice facility.  He has required around-the-clock dosing of pain medications, which would be most appropriately administered in a hospice facility focused on his comfort.  Patient stated that he was in agreement with hospice but did not like the idea of a hospice facility.  His preference would be to move in with his girlfriend and he felt that he would have  adequate support from his girlfriend and friend.  He said that he would be okay with having hospice follow at his girlfriend's house.  I called and spoke with his uncle.  His uncle says that he talked with patient's girlfriend yesterday and that there is no family support to make returning home an option.  The girlfriend works 12-hour shifts and is unavailable to help with patient's daily care.  The uncle plans to call patient today to try to convince him to accept going to the Hospice Home.  PLAN: -Recommend best supportive care and Hospice Home -TOC to help coordinate disposition  Case and plan discussed with Dr. Kurtis Bushman and Dr. Rogue Bussing  Time Total: 35 minutes  Visit consisted of counseling and education dealing with  the complex and emotionally intense issues of symptom management and palliative care in the setting of serious and potentially life-threatening illness.Greater than 50%  of this time was spent counseling and coordinating care related to the above assessment and plan.  Signed by: Altha Harm, PhD, NP-C

## 2021-03-12 MED ORDER — OXYCODONE HCL 5 MG PO TABS
10.0000 mg | ORAL_TABLET | ORAL | Status: DC | PRN
Start: 2021-03-12 — End: 2021-03-13
  Administered 2021-03-13 (×2): 10 mg via ORAL
  Filled 2021-03-12 (×2): qty 2

## 2021-03-12 MED ORDER — HYDROMORPHONE HCL 1 MG/ML IJ SOLN
0.5000 mg | INTRAMUSCULAR | Status: DC | PRN
  Administered 2021-03-12: 22:00:00 0.5 mg via INTRAVENOUS
  Filled 2021-03-12: qty 1

## 2021-03-12 NOTE — Progress Notes (Signed)
Rounded on patient, denies any needs.

## 2021-03-12 NOTE — Progress Notes (Addendum)
[facility]) Hospital Liaison RN note:  Received request from Altha Harm, NP for family interest in [place]. Chart reviewed and eligibility was approved. Spoke with patient in room to confirm interest and explain services. He verbalized his wish to come to the Hospice Home. Unfortunately, Hospice Home does not have room to offer today. Hospital care team is aware. [place] Liaison will follow for room availability.  Please call with any hospice related questions or concerns.  Thank you for the opportunity to participate in this patient's care.  Zandra Abts, RN [facility] Liaison  [phone]

## 2021-03-12 NOTE — Progress Notes (Signed)
PT Cancellation Note  Patient Details Name: Clayton Gill MRN: [id number] DOB: [birth year]   Cancelled Treatment:    Reason Eval/Treat Not Completed: Other (comment).  Chart reviewed.  Per chart, plan for pt to go to hospice home.  D/t this, therapy will sign off at this time (discussed with MD Maylene Roes via secure message and MD agreeable to therapy signing off).   Leitha Bleak, PT 03/12/21, 4:49 PM

## 2021-03-12 NOTE — Progress Notes (Signed)
Pt using BSC, vital signs are delayed, will reattempt at later time.

## 2021-03-12 NOTE — Progress Notes (Signed)
Nutrition Brief Note  Chart reviewed. Pt now transitioning to comfort care. Plan to discharge to hospice house per [facility] notes.  No further nutrition interventions planned at this time.  Please re-consult as needed.   Loistine Chance, RD, LDN, [place] Registered Dietitian II Certified Diabetes Care and Education Specialist Please refer to [facility] for RD and/or RD on-call/weekend/after hours pager

## 2021-03-12 NOTE — Progress Notes (Signed)
Pt atemptedto use BSC, no BM or urine noted.

## 2021-03-12 NOTE — Progress Notes (Signed)
   03/12/21 1929  Assess: MEWS Score  Temp 98.2 F (36.8 C)  BP 100/69  Pulse Rate 82  Resp 10  Assess: MEWS Score  MEWS Temp 0  MEWS Systolic 1  MEWS Pulse 0  MEWS RR 1  MEWS LOC 0  MEWS Score 2  MEWS Score Color Yellow  Assess: if the MEWS score is Yellow or Red  Were vital signs taken at a resting state? Yes  Focused Assessment No change from prior assessment  Early Detection of Sepsis Score *See Row Information* Low  MEWS guidelines implemented *See Row Information* Yes  Take Vital Signs  Increase Vital Sign Frequency  Yellow: Q 2hr X 2 then Q 4hr X 2, if remains yellow, continue Q 4hrs  Escalate  MEWS: Escalate Yellow: discuss with charge nurse/RN and consider discussing with provider and RRT  Notify: Charge Nurse/RN  Name of Charge Nurse/RN Notified Stanton Kidney  Date Charge Nurse/RN Notified 03/12/21  Time Charge Nurse/RN Notified 2019  Notify: Provider  Provider Name/Title B. Randol Kern  Date Provider Notified 03/12/21  Time Provider Notified 2019  Notification Type Page Shea Evans)  Notification Reason Other (Comment) (Mews yellow)  Provider response At bedside  Date of Provider Response 03/12/21  Time of Provider Response 2015  Document  Patient Outcome Other (Comment) (continue to monitor. Telemetry placed)

## 2021-03-12 NOTE — Plan of Care (Signed)
Rounded on pt, pt requested foot bath. Washed with warm towel and dried. Pt denies any further needs.

## 2021-03-12 NOTE — Progress Notes (Signed)
PROGRESS NOTE    Clayton Gill  WFU:[id number] DOB: 07-21-1951 DOA: 03/06/2021 PCP: Theotis Burrow, MD     Brief Narrative:  Clayton Gill is a 70 y.o.malewith medical history significant forrecently diagnosed pancreatic cancer on chemotherapy, biliary obstruction status post placement of biliary stent, hypertension, hyperlipidemia, chronic hepatitis C, AAA status post repair,who is admitted to [facility] on 2/02/5427CWCB acute metabolic encephalopathy. The patient reports that he was diagnosed with pancreatic cancer approximately 3 weeks ago, with interval initiation ofchemotherapyx 1 session, which hereports occurred 1 week ago. The patient was reportedly scheduled for his next chemo therapy session today.When the patient's neighbor, who was responsible for taking the patient to this chemotherapy session arrived at the patient's home, he found the patient to be laying in bed and noted him to be somnolent, lethargic, and mildly confused relative to baseline,prompting the patient to be brought to [facility] ED for further evaluation.Per chart review, it appears that the patient has a history of biliary obstruction, status post placement of metallic biliary stent. He has a history of chronic hepatitis C,with most recent HCVRNA PCR showing a nondetectable viral load on 12/18/2020.The patient reports that he he was treated with antiviral therapy as an outpatient, and conveys that he completed this therapy approximately 6 months ago.   Patient's mental status has waxed and waned throughout hospital stay. Pt has poor functional status and pt is very malnourished. Palliative care was consulted.  After meeting with palliative care medicine, oncology, patient and family ultimately decided to transition patient to residential hospice.  New events last 24 hours / Subjective: Patient eating breakfast in bed this morning.  He has no new complaints, has some abdominal  soreness.  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  Awaiting residential hospice placement   Difficult to place patient No    Antimicrobials:  Anti-infectives (From admission, onward)   None        Objective: Vitals:   03/12/21 0419 03/12/21 0617 03/12/21 0747 03/12/21 1212  BP: 119/85  110/89 105/77  Pulse: 76  80 89  Resp: 16  15 18   Temp: 98.2 F (36.8 C)  98.4 F (36.9 C) 98.4 F (36.9 C)  TempSrc:   Oral Oral  SpO2:   100% 100%  Weight:  52.6 kg    Height:        Intake/Output  Summary (Last 24 hours) at 03/12/2021 1215 Last data filed at 03/11/2021 1525 Gross per 24 hour  Intake --  Output 200 ml  Net -200 ml   Filed Weights   03/09/21 0649 03/10/21 0503 03/12/21 0617  Weight: 56.5 kg 54.2 kg 52.6 kg    Examination:  General exam:  Appears calm and comfortable  Respiratory system: Clear to auscultation. Respiratory effort normal. No respiratory distress. No conversational dyspnea.  Cardiovascular system: S1 & S2 heard, RRR. No murmurs. No pedal edema. Gastrointestinal system: Abdomen is nondistended, soft  Central nervous system: Alert. No focal neurological deficits. Speech clear.  Extremities: Symmetric in appearance  Skin: No rashes, lesions or ulcers on exposed skin  Psychiatry: Stable   Data Reviewed: I have personally reviewed following labs and imaging studies  CBC: Recent Labs  Lab 03/06/21 0855 03/07/21 0522 03/08/21 0758 03/09/21 0442 03/10/21 0629 03/11/21 0450  WBC 4.6 5.4 5.4 4.8 11.9* 6.7  NEUTROABS 3.3  --   --   --   --   --   HGB 10.9* 10.9* 11.2* 10.1* 11.7* 11.2*  HCT 32.3* 32.1* 33.0* 30.1* 35.1* 33.4*  MCV 92.6 90.9 90.4 92.6 92.1 93.3  PLT 100* 101* 88* 73* 66* 56*   Basic Metabolic Panel: Recent Labs  Lab 03/06/21 0855 03/07/21 0522 03/08/21 0758 03/09/21 0442 03/10/21 0629 03/11/21 0450  NA 140 139 135 138 139 140  K 4.2 4.2 3.7 3.9 4.2 4.3  CL 107 107 104 108 108 108  CO2 25 25 22 24 24 24   GLUCOSE 72 84 103* 99 108* 116*  BUN 41* 53* 45* 39* 44* 45*  CREATININE 1.42* 1.36* 1.19 1.03 1.29* 1.21  CALCIUM 8.7* 8.8* 8.3* 8.1* 8.3* 8.5*  MG 1.6* 1.9  --  1.7 1.8 1.9   GFR: Estimated Creatinine Clearance: 43.5 mL/min (by C-G formula based on SCr of 1.21 mg/dL). Liver Function Tests: Recent Labs  Lab 03/06/21 0855 03/07/21 0522 03/09/21 0442 03/10/21 0629 03/11/21 0450  AST 59* 59* 103* 149* 109*  ALT 97* 94* 99* 130* 119*  ALKPHOS 513* 486* 530* 600* 609*  BILITOT 2.9* 2.6* 2.3* 2.4* 2.1*  PROT 6.8 6.4* 5.9* 6.2* 6.2*  ALBUMIN 2.8* 2.7* 2.4* 2.5* 2.4*   Recent Labs  Lab 03/06/21 0855  LIPASE 25   Recent Labs  Lab 03/06/21 0854 03/08/21 0758 03/09/21 0442 03/10/21 0629 03/11/21 0450  AMMONIA 112* 110* 108* 34 80*   Coagulation Profile: Recent Labs   Lab 03/06/21 0855 03/07/21 0522  INR 1.4* 1.6*   Cardiac Enzymes: Recent Labs  Lab 03/06/21 0855  CKTOTAL 129   BNP (last 3 results) No results for input(s): PROBNP in the last 8760 hours. HbA1C: No results for input(s): HGBA1C in the last 72 hours. CBG: Recent Labs  Lab 03/06/21 2228  GLUCAP 133*   Lipid Profile: No results for input(s): CHOL, HDL, LDLCALC, TRIG, CHOLHDL, LDLDIRECT in the last 72 hours. Thyroid Function Tests: No results for input(s): TSH, T4TOTAL, FREET4, T3FREE, THYROIDAB in the last 72 hours. Anemia Panel: No results for input(s): VITAMINB12, FOLATE, FERRITIN, TIBC, IRON, RETICCTPCT in the last 72 hours. Sepsis Labs: No results for input(s): PROCALCITON, LATICACIDVEN in the last 168 hours.  Recent Results (from the past 240 hour(s))  Resp Panel by RT-PCR (Flu A&B, Covid) Nasopharyngeal Swab     Status: None   Collection Time: 03/06/21  8:48 AM   Specimen: Nasopharyngeal Swab; Nasopharyngeal(NP) swabs in vial transport medium  Result Value Ref Range Status  SARS Coronavirus 2 by RT PCR NEGATIVE NEGATIVE Final    Comment: (NOTE) SARS-CoV-2 target nucleic acids are NOT DETECTED.  The SARS-CoV-2 RNA is generally detectable in upper respiratory specimens during the acute phase of infection. The lowest concentration of SARS-CoV-2 viral copies this assay can detect is 138 copies/mL. A negative result does not preclude SARS-Cov-2 infection and should not be used as the sole basis for treatment or other patient management decisions. A negative result may occur with  improper specimen collection/handling, submission of specimen other than nasopharyngeal swab, presence of viral mutation(s) within the areas targeted by this assay, and inadequate number of viral copies(<138 copies/mL). A negative result must be combined with clinical observations, patient history, and epidemiological information. The expected result is Negative.  Fact Sheet for Patients:   EntrepreneurPulse.com.au  Fact Sheet for Healthcare Providers:  IncredibleEmployment.be  This test is no t yet approved or cleared by the [place] FDA and  has been authorized for detection and/or diagnosis of SARS-CoV-2 by FDA under an Emergency Use Authorization (EUA). This EUA will remain  in effect (meaning this test can be used) for the duration of the COVID-19 declaration under Section 564(b)(1) of the Act, 21 U.S.C.section 360bbb-3(b)(1), unless the authorization is terminated  or revoked sooner.       Influenza A by PCR NEGATIVE NEGATIVE Final   Influenza B by PCR NEGATIVE NEGATIVE Final    Comment: (NOTE) The Xpert Xpress SARS-CoV-2/FLU/RSV plus assay is intended as an aid in the diagnosis of influenza from Nasopharyngeal swab specimens and should not be used as a sole basis for treatment. Nasal washings and aspirates are unacceptable for Xpert Xpress SARS-CoV-2/FLU/RSV testing.  Fact Sheet for Patients: EntrepreneurPulse.com.au  Fact Sheet for Healthcare Providers: IncredibleEmployment.be  This test is not yet approved or cleared by the [place] FDA and has been authorized for detection and/or diagnosis of SARS-CoV-2 by FDA under an Emergency Use Authorization (EUA). This EUA will remain in effect (meaning this test can be used) for the duration of the COVID-19 declaration under Section 564(b)(1) of the Act, 21 U.S.C. section 360bbb-3(b)(1), unless the authorization is terminated or revoked.  Performed at [facility], [address] [place], [place] [address]   Culture, blood (Routine X 2) w Reflex to ID Panel     Status: None   Collection Time: 03/06/21 11:29 PM   Specimen: BLOOD  Result Value Ref Range Status   Specimen Description BLOOD BLOOD RIGHT HAND  Final   Special Requests   Final    BOTTLES DRAWN AEROBIC AND ANAEROBIC Blood Culture adequate volume   Culture    Final    NO GROWTH 5 DAYS Performed at [facility], [address], [place], [place] [address]    Report Status 03/11/2021 FINAL  Final  Culture, blood (Routine X 2) w Reflex to ID Panel     Status: None   Collection Time: 03/06/21 11:29 PM   Specimen: BLOOD  Result Value Ref Range Status   Specimen Description BLOOD BLOOD LEFT HAND  Final   Special Requests IN PEDIATRIC BOTTLE Blood Culture adequate volume  Final   Culture   Final    NO GROWTH 5 DAYS Performed at [facility], [address], [place], [place] [address]    Report Status 03/11/2021 FINAL  Final  Body fluid culture w Gram Stain     Status: None   Collection Time: 03/07/21  1:45 PM   Specimen: PATH Cytology Peritoneal fluid  Result Value Ref Range Status   Specimen Description   Final    PERITONEAL Performed at [facility], [address], [place], [place] [address]    Special Requests   Final    NONE Performed at [facility], [address] [place], [place] [address]    Gram Stain   Final    RARE WBC PRESENT, PREDOMINANTLY MONONUCLEAR NO ORGANISMS SEEN    Culture   Final    NO GROWTH Performed at [facility], [address], [place], [place] [address]    Report Status 03/11/2021 FINAL  Final      Radiology Studies: US Paracentesis  Result Date: 03/11/2021 INDICATION: Pancreas carcinoma, abdominal distension, recurrent ascites EXAM: ULTRASOUND GUIDED PARACENTESIS MEDICATIONS: 1% lidocaine COMPLICATIONS: None immediate. PROCEDURE: Informed written consent was obtained from the patient after a discussion of the risks, benefits and alternatives to treatment. A timeout was performed prior to the initiation of the procedure. Initial ultrasound scanning demonstrates a large amount of ascites within the right lower abdominal quadrant. The right lower abdomen was prepped and draped in the usual sterile fashion. 1% lidocaine was used for local anesthesia. Following  this, a 6 Fr Safe-T-Centesis catheter was introduced. An ultrasound image was saved for documentation purposes. The paracentesis was performed. The catheter was removed and a dressing was applied. The patient tolerated the procedure well without immediate post procedural complication. FINDINGS: A total of approximately 4.2 L of opaque yellow peritoneal fluid was removed. IMPRESSION: Successful ultrasound-guided paracentesis yielding 4.2 liters of peritoneal fluid. Electronically Signed   By: Jerilynn Mages.  Shick M.D.   On: 03/11/2021 15:18      Scheduled Meds: . Chlorhexidine Gluconate Cloth  6 each Topical Daily  . feeding supplement  237 mL Oral TID BM  . lactulose  30 g Oral Daily  . nicotine  21 mg Transdermal Daily   Continuous Infusions: . ondansetron (ZOFRAN) IV       LOS: 6 days      Time spent: 30 minutes   Dessa Phi, DO [facility] 03/12/2021, 12:15 PM   Available via Epic secure chat 7am-7pm After these hours, please refer to coverage provider listed on [email or web address]

## 2021-03-12 NOTE — Progress Notes (Signed)
Pt wanting to speak with palliative, had some questions, spoke with NP [employer], states he will see pt in the morning, pt made aware, pt ok with speaking to him in the morning

## 2021-03-12 NOTE — Progress Notes (Signed)
Cross Cover Oxycodone and dilaudid doses decreased secondary to respiratory depression and decreased mental status.  Patient started on continuous pulse ox monitoring

## 2021-03-12 NOTE — Progress Notes (Signed)
Rounded on patient, patient c/o pain. Primary nurse notified. PRN meds given as ordered.

## 2021-03-12 NOTE — TOC Progression Note (Signed)
Transition of Care [facility]) - Progression Note    Patient Details  Name: Clayton Gill MRN: [id number] Date of Birth: [birth year]  Transition [facility]) CM/SW Brooke, RN Phone Number: 03/12/2021, 11:02 AM  Clinical Narrative:   TOC received call from uncle.  States that he spoke with patient, who is agreeable to Hospice, and requests placement in [facility].  TOC went to bedside, patient amenable to [facility].  Penny from [employer] notified, she will meet with patient today and discuss.  TOC will follow through discharge.         Expected Discharge Plan and Services                                                 Social Determinants of Health (SDOH) Interventions    Readmission Risk Interventions No flowsheet data found.

## 2021-03-13 DIAGNOSIS — I248 Other forms of acute ischemic heart disease: Secondary | ICD-10-CM

## 2021-03-13 DIAGNOSIS — K7682 Hepatic encephalopathy: Secondary | ICD-10-CM

## 2021-03-13 DIAGNOSIS — K7200 Acute and subacute hepatic failure without coma: Secondary | ICD-10-CM

## 2021-03-13 LAB — BLOOD GAS, VENOUS
Acid-Base Excess: 0.3 mmol/L (ref 0.0–2.0)
Bicarbonate: 25.4 mmol/L (ref 20.0–28.0)
O2 Saturation: 86 %
Patient temperature: 37
pCO2, Ven: 42 mmHg — ABNORMAL LOW (ref 44.0–60.0)
pH, Ven: 7.39 (ref 7.250–7.430)
pO2, Ven: 52 mmHg — ABNORMAL HIGH (ref 32.0–45.0)

## 2021-03-13 NOTE — Care Management Important Message (Signed)
Important Message  Patient Details  Name: Clayton Gill MRN: [id number] Date of Birth: 1951/07/30   Medicare Important Message Given:  Other (see comment)  Patient is waiting on Hospice Home bed.  Out of respect for the patient and family no Important Message from [facility] given.  Juliann Pulse A Luchiano Viscomi 03/13/2021, 7:42 AM

## 2021-03-13 NOTE — Progress Notes (Signed)
Report called to Santiago Glad at [facility]

## 2021-03-13 NOTE — TOC Transition Note (Signed)
Transition of Care [facility]) - CM/SW Discharge Note   Patient Details  Name: Clayton Gill MRN: [id number] Date of Birth: 1951/05/15  Transition [facility]) CM/SW Contact:  Pete Pelt, RN Phone Number: 03/13/2021, 8:38 AM   Clinical Narrative:  TOC received notification from Hospice that there is an inpatient bed open today, [facility], [facility] will arrive at 1130AM to transport patient.  Hospice has contacted Uncle, who is on his way to facility.  COVID neg 3/31, okayed per Hospice.       Final next level of care: [place] Barriers to Discharge: Barriers Resolved   Patient Goals and CMS Choice     Choice offered to / list presented to : NA  Discharge Placement                Patient to be transferred to facility by: [facility] Name of family member notified: Uncle notified by Hospice Patient and family notified of of transfer: 03/13/21  Discharge Plan and Services                DME Arranged:  (NA)         HH Arranged:  (NA)          Social Determinants of Health (SDOH) Interventions     Readmission Risk Interventions No flowsheet data found.

## 2021-03-13 NOTE — Discharge Summary (Signed)
Physician Discharge Summary  Clayton Gill PQZ:[id number] DOB: 1951-08-27 DOA: 03/06/2021  PCP: Theotis Burrow, MD  Admit date: 03/06/2021 Discharge date: 03/13/2021  Admitted From: Home Disposition:  Residential Hospice   Discharge Condition: Terminal CODE STATUS: DNR  Diet recommendation: Regular   Brief/Interim Summary: Clayton Gill is a 70 y.o.malewith medical history significant forrecently diagnosed pancreatic cancer on chemotherapy, biliary obstruction status post placement of biliary stent, hypertension, hyperlipidemia, chronic hepatitis C, AAA status post repair,who is admitted to [facility] on 3/35/[id number] acute metabolic encephalopathy. The patient reports that he was diagnosed with pancreatic cancer approximately 3 weeks ago, with interval initiation ofchemotherapyx 1 session, which hereports occurred 1 week ago. The patient was reportedly scheduled for his next chemo therapy session today.When the patient's neighbor, who was responsible for taking the patient to this chemotherapy session arrived at the patient's home, he found the patient to be laying in bed and noted him to be somnolent, lethargic, and mildly confused relative to baseline,prompting the patient to be brought to [facility] ED for further evaluation.Per chart review, it appears that the patient has a history of biliary obstruction, status post placement of metallic biliary stent. He has a history of chronic hepatitis C,with most recent HCVRNA PCR showing a nondetectable viral load on 12/18/2020.The patient reports that he he was treated with antiviral therapy as an outpatient, and conveys that he completed this therapy approximately 6 months ago.   Patient's mental status has waxed and waned throughout hospital stay. Pt has poor functional status and pt is very malnourished. Palliative care was consulted.  After meeting with palliative care medicine, oncology, patient and  family ultimately decided to transition patient to residential hospice.  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 [facility])   Palliative care encounter   Demand ischemia [facility])   Acute hepatic encephalopathy    Discharge Instructions  Discharge Instructions    No wound care   Complete by: As directed      Allergies as of 03/13/2021   No Known Allergies     Medication List    STOP taking these medications   amLODipine 10 MG tablet Commonly known as: NORVASC   aspirin EC 81 MG tablet   atorvastatin 40 MG tablet Commonly known as: LIPITOR   clopidogrel 75 MG tablet Commonly known as: PLAVIX   dicyclomine 10 MG capsule Commonly known as: BENTYL   fentaNYL 50 MCG/HR Commonly known as: DURAGESIC   hydrochlorothiazide 25 MG tablet Commonly known as: HYDRODIURIL   lisinopril 20 MG tablet Commonly known as: ZESTRIL   lisinopril 40 MG tablet Commonly known as: ZESTRIL   loperamide 2 MG capsule Commonly known as: IMODIUM   OLANZapine 5 MG tablet Commonly known as: ZYPREXA   omeprazole 20 MG capsule Commonly known as: PRILOSEC   ondansetron 8 MG tablet Commonly known as: ZOFRAN   oxyCODONE 5 MG immediate release tablet Commonly known as: Oxy IR/ROXICODONE   pantoprazole 40 MG tablet Commonly known as: PROTONIX   polyethylene glycol 17 g packet Commonly known as: MIRALAX / GLYCOLAX   prochlorperazine 10 MG tablet Commonly known as: COMPAZINE   simvastatin 20 MG tablet Commonly known as: ZOCOR       No Known Allergies  Consultations:  Oncology  Palliative care    Procedures/Studies: CT Head Wo Contrast  Result Date: 03/06/2021 CLINICAL DATA:  Found on floor, fall,  pancreatic cancer EXAM: CT HEAD WITHOUT CONTRAST CT CERVICAL SPINE WITHOUT CONTRAST TECHNIQUE: Multidetector CT imaging of the head and cervical spine was performed following the standard protocol without intravenous contrast. Multiplanar CT image reconstructions of the cervical spine were also generated. COMPARISON:  None. FINDINGS: CT HEAD FINDINGS Brain: No evidence of acute infarction, hemorrhage, hydrocephalus, extra-axial collection or mass lesion/mass effect. Periventricular and deep white matter hypodensity 3 Vascular: No hyperdense vessel or unexpected calcification. Skull: Normal. Negative for fracture or focal lesion. Sinuses/Orbits: No acute finding. Other: None. CT CERVICAL SPINE FINDINGS Alignment: Normal. Skull base and vertebrae: No acute fracture. No primary bone lesion or focal pathologic process. Soft tissues and spinal canal: No prevertebral fluid or swelling. No visible canal hematoma. Disc levels: Intact. Moderate multilevel bilateral facet degenerative change. Upper chest: Negative. Other: None. IMPRESSION: 1. No acute intracranial pathology. Small-vessel white matter disease. 2. No fracture or static subluxation of the cervical spine. Moderate multilevel bilateral facet degenerative change. Electronically Signed   By: Eddie Candle M.D.   On: 03/06/2021 12:40   CT Cervical Spine Wo Contrast  Result Date: 03/06/2021 CLINICAL DATA:  Found on floor, fall, pancreatic cancer EXAM: CT HEAD WITHOUT CONTRAST CT CERVICAL SPINE WITHOUT CONTRAST TECHNIQUE: Multidetector CT imaging of the head and cervical spine was performed following the standard protocol without intravenous contrast. Multiplanar CT image reconstructions of the cervical spine were also generated. COMPARISON:  None. FINDINGS: CT HEAD FINDINGS Brain: No evidence of acute infarction, hemorrhage, hydrocephalus, extra-axial collection or mass lesion/mass effect. Periventricular and deep white matter hypodensity 3 Vascular: No hyperdense  vessel or unexpected calcification. Skull: Normal. Negative for fracture or focal lesion. Sinuses/Orbits: No acute finding. Other: None. CT CERVICAL SPINE FINDINGS Alignment: Normal. Skull base and vertebrae: No acute fracture. No primary bone lesion or focal pathologic process. Soft tissues and spinal canal: No prevertebral fluid or swelling. No visible canal hematoma. Disc levels: Intact. Moderate multilevel bilateral facet degenerative change. Upper chest: Negative. Other: None. IMPRESSION: 1. No acute intracranial pathology. Small-vessel white matter disease. 2. No fracture or static subluxation of the cervical spine. Moderate multilevel bilateral facet degenerative change. Electronically Signed   By: Eddie Candle M.D.   On: 03/06/2021 12:40   US Paracentesis  Result Date: 03/11/2021 INDICATION: Pancreas carcinoma, abdominal distension, recurrent ascites EXAM: ULTRASOUND GUIDED PARACENTESIS MEDICATIONS: 1% lidocaine COMPLICATIONS: None immediate. PROCEDURE: Informed written consent was obtained from the patient after a discussion of the risks, benefits and alternatives to treatment. A timeout was performed prior to the initiation of the procedure. Initial ultrasound scanning demonstrates a large amount of ascites within the right lower abdominal quadrant. The right lower abdomen was prepped and draped in the usual sterile fashion. 1% lidocaine was used for local anesthesia. Following this, a 6 Fr Safe-T-Centesis catheter was introduced. An ultrasound image was saved for documentation purposes. The paracentesis was performed. The catheter was removed and a dressing was applied. The patient tolerated the procedure well without immediate post procedural complication. FINDINGS: A total of approximately 4.2 L of opaque yellow peritoneal fluid was removed. IMPRESSION: Successful ultrasound-guided paracentesis yielding 4.2 liters of peritoneal fluid. Electronically Signed   By: Jerilynn Mages.  Shick M.D.   On: 03/11/2021 15:18    US Paracentesis  Result Date: 03/07/2021 INDICATION: Patient with recently diagnosed pancreatic cancer presents today for a diagnostic and therapeutic paracentesis. EXAM: ULTRASOUND GUIDED PARACENTESIS MEDICATIONS:  1% lidocaine 10 mL COMPLICATIONS: None immediate. PROCEDURE: Informed written consent was obtained from the patient after a discussion of the risks, benefits and alternatives to treatment. A timeout was performed prior to the initiation of the procedure. Initial ultrasound scanning demonstrates a large amount of ascites within the right lower abdominal quadrant. The right lower abdomen was prepped and draped in the usual sterile fashion. 1% lidocaine was used for local anesthesia. Following this, a 6 Fr Safe-T-Centesis catheter was introduced. An ultrasound image was saved for documentation purposes. The paracentesis was performed. The catheter was removed and a dressing was applied. The patient tolerated the procedure well without immediate post procedural complication. FINDINGS: A total of approximately 700 mL of clear yellow fluid was removed. Samples were sent to the laboratory as requested by the clinical team. IMPRESSION: Successful ultrasound-guided paracentesis yielding 700 mL of peritoneal fluid. Read by: Soyla Dryer, NP Electronically Signed   By: Aletta Edouard M.D.   On: 03/07/2021 14:50   DG Chest Portable 1 View  Result Date: 03/06/2021 CLINICAL DATA:  70 year old male with pancreatic cancer, found down. Lethargy. EXAM: PORTABLE CHEST 1 VIEW COMPARISON:  CT chest 10/28/2020. FINDINGS: Portable AP upright view at 0853 hours. New right chest Port-A-Cath. Normal cardiac size and mediastinal contours. Visualized tracheal air column is within normal limits. Allowing for portable technique the lungs are clear. No pneumothorax. No acute osseous abnormality identified. Stable cholecystectomy clips. Paucity of bowel gas in the upper abdomen. IMPRESSION: Negative portable chest with  right side Port-A-Cath in place. Electronically Signed   By: Genevie Ann M.D.   On: 03/06/2021 09:07   CT Angio Abd/Pel w/ and/or w/o  Result Date: 03/06/2021 CLINICAL DATA:  70 year old with suspected abdominal mass. History of pancreatic cancer. EXAM: CTA ABDOMEN AND PELVIS WITH CONTRAST TECHNIQUE: Multidetector CT imaging of the abdomen and pelvis was performed using the standard protocol during bolus administration of intravenous contrast. Multiplanar reconstructed images and MIPs were obtained and reviewed to evaluate the vascular anatomy. CONTRAST:  23mL OMNIPAQUE IOHEXOL 350 MG/ML SOLN COMPARISON:  CT report from 12/17/2020 from [place]. 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 Aortic stent below the SMA. Small amount of mural thrombus within the main aortic graft body but no significant stenosis. Abdominal aortic aneurysm roughly measures 2.5 x 2.6 cm on sequence 5 5, image 144. Limited evaluation for an endoleak due to the study technique. No evidence for aortic rupture. Celiac: Main celiac trunk is patent and small. Main branches coming off the main celiac trunk are the common hepatic artery and the splenic artery. The splenic artery has a very beaded configuration and could represent fibromuscular dysplasia. The left gastric artery originates just proximal to the celiac trunk and there is a replaced left hepatic artery. SMA: SMA is patent with a replaced right hepatic artery. Renals: Left renal artery is widely patent without aneurysm, dissection or significant stenosis. Right renal artery is patent but difficult to exclude mild narrowing near the origin. In addition, there is a small accessory right renal artery supplying the lower pole that is likely excluded from the aortic stent graft with retrograde flow. IMA: Origin is occluded due to the aortic stent graft. There is reconstitution of IMA branches and limited  evaluation for endoleak. Inflow: Bifurcated aortic stent graft extends into the bilateral iliac arteries. Left-sided stents terminate near the distal left common iliac artery. The distal left common iliac artery measures up to 2.3 cm  without a significant aneurysm sac. Patient has stents involving the right external and right internal iliac artery. Bilateral internal iliac arteries are patent. There appears to be a saccular type aneurysm involving the left external iliac artery that roughly measures 1 cm on sequence 5, image 178. Atherosclerotic plaque and stenosis in the distal right external iliac artery. Proximal Outflow: Limited evaluation of the proximal outflow vessels due to timing of the examination. There appears to be bypass graft in the left groin. Limited evaluation of this left femoral bypass. Atherosclerotic plaque involving the right common femoral artery. Veins: Limited evaluation due to the phase of contrast. Review of the MIP images confirms the above findings. NON-VASCULAR Lower chest: Tiny nodules in the right lower lobe were probably present on the exam from 10/28/2020. Small amount of dependent atelectasis in the right lower lobe. No large pleural effusions. Underlying emphysematous changes in the lungs. Hepatobiliary: Large amount of perihepatic ascites. There is a metallic biliary stent with pneumobilia. Limited evaluation for hepatic lesions to the phase of contrast but there is subtle low-density in left hepatic lobe near segment 2 and segment 4A that measures roughly 3.7 cm. Based on the previous [facility] report, patient had lesion in this area. Difficult to exclude interval enlargement based on these images. Pancreas: Limited evaluation due to the phase of contrast. However, there are at least 3 cystic areas near the pancreatic tail region that are most likely related to the known mass in this area. Largest cystic area measures up to 3.4 cm on sequence 12, image 6. The largest cystic area is  probably abutting or involving the posterior gastric wall. Spleen: Spleen size is within normal limits. Adrenals/Urinary Tract: Limited evaluation of the adrenal glands. Atrophy or old infarct in the inferior right kidney related to the excluded right accessory renal artery. Stomach/Bowel: No significant dilatation of bowel structures. Very limited evaluation for bowel inflammation due to phase of contrast. Cystic lesion near the pancreatic tail appears to be involving or abutting the posterior wall the stomach. Lymphatic: Soft tissue fullness in the left periaortic region that could represent lymphadenopathy but poorly characterized. Abnormal stranding or soft tissue in the upper abdomen near the pancreas Reproductive: Limited evaluation of the prostate. Large fluid collection on the right side of scrotum could represent a large hydrocele. Other: Large amount of ascites in the abdomen and pelvis. Limited evaluation for peritoneal or omental disease on this examination. Musculoskeletal: No acute bone abnormality. IMPRESSION: VASCULAR 1. Bifurcated aortic stent graft is patent. Limited evaluation of the aneurysm sac and limited evaluation for an endoleak. 2. 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 Variant celiac artery anatomy as described. 3. Atrophy or scarring involving the right kidney lower pole likely related to the excluded accessory right renal artery. 4. 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 NON-VASCULAR 1. Large amount of abdominal and pelvic ascites. This appears to be a new finding based on the previous report. 2. Limited evaluation of the patient's known pancreatic mass. Poorly visualized cystic lesions along the posterior wall the stomach and near the pancreatic tail likely represent the known pancreatic lesion. In addition there, is abnormal soft tissue near the celiac trunk and periaortic regions that could represent lymphadenopathy.  Difficult to exclude omental thickening on this examination. 3. Metallic biliary stent with pneumobilia. 4. Probable low-density lesion in left hepatic lobe which corresponds with the previous report from [facility]. This area is poorly defined but may have enlarged since the previous  examination. Findings are worrisome for progression of disease. 5. Probable right hydrocele. Electronically Signed   By: Markus Daft M.D.   On: 03/06/2021 13:09       Discharge Exam: Vitals:   03/13/21 0356 03/13/21 0810  BP: (!) 110/91 105/76  Pulse: 78 82  Resp: 12 15  Temp: 98.5 F (36.9 C) 98.5 F (36.9 C)  SpO2: 100% 100%    General: Pt is alert, awake, not in acute distress, cachectic and malnourished  Cardiovascular: RRR, S1/S2 +, no edema Respiratory: CTA bilaterally, no wheezing, no rhonchi, no respiratory distress, no conversational dyspnea  Abdominal: Soft, NT, ND, bowel sounds + Extremities: no edema, no cyanosis Psych: Normal mood and affect, stable judgement and insight     The results of significant diagnostics from this hospitalization (including imaging, microbiology, ancillary and laboratory) are listed below for reference.     Microbiology: Recent Results (from the past 240 hour(s))  Resp Panel by RT-PCR (Flu A&B, Covid) Nasopharyngeal Swab     Status: None   Collection Time: 03/06/21  8:48 AM   Specimen: Nasopharyngeal Swab; Nasopharyngeal(NP) swabs in vial transport medium  Result Value Ref Range Status   SARS Coronavirus 2 by RT PCR NEGATIVE NEGATIVE Final    Comment: (NOTE) SARS-CoV-2 target nucleic acids are NOT DETECTED.  The SARS-CoV-2 RNA is generally detectable in upper respiratory specimens during the acute phase of infection. The lowest concentration of SARS-CoV-2 viral copies this assay can detect is 138 copies/mL. A negative result does not preclude SARS-Cov-2 infection and should not be used as the sole basis for treatment or other patient management decisions. A  negative result may occur with  improper specimen collection/handling, submission of specimen other than nasopharyngeal swab, presence of viral mutation(s) within the areas targeted by this assay, and inadequate number of viral copies(<138 copies/mL). A negative result must be combined with clinical observations, patient history, and epidemiological information. The expected result is Negative.  Fact Sheet for Patients:  EntrepreneurPulse.com.au  Fact Sheet for Healthcare Providers:  IncredibleEmployment.be  This test is no t yet approved or cleared by the [place] FDA and  has been authorized for detection and/or diagnosis of SARS-CoV-2 by FDA under an Emergency Use Authorization (EUA). This EUA will remain  in effect (meaning this test can be used) for the duration of the COVID-19 declaration under Section 564(b)(1) of the Act, 21 U.S.C.section 360bbb-3(b)(1), unless the authorization is terminated  or revoked sooner.       Influenza A by PCR NEGATIVE NEGATIVE Final   Influenza B by PCR NEGATIVE NEGATIVE Final    Comment: (NOTE) The Xpert Xpress SARS-CoV-2/FLU/RSV plus assay is intended as an aid in the diagnosis of influenza from Nasopharyngeal swab specimens and should not be used as a sole basis for treatment. Nasal washings and aspirates are unacceptable for Xpert Xpress SARS-CoV-2/FLU/RSV testing.  Fact Sheet for Patients: EntrepreneurPulse.com.au  Fact Sheet for Healthcare Providers: IncredibleEmployment.be  This test is not yet approved or cleared by the [place] FDA and has been authorized for detection and/or diagnosis of SARS-CoV-2 by FDA under an Emergency Use Authorization (EUA). This EUA will remain in effect (meaning this test can be used) for the duration of the COVID-19 declaration under Section 564(b)(1) of the Act, 21 U.S.C. section 360bbb-3(b)(1), unless the authorization  is terminated or revoked.  Performed at [facility], [address] [place], [place] [address]   Culture, blood (Routine X 2) w Reflex to ID Panel  Status: None   Collection Time: 03/06/21 11:29 PM   Specimen: BLOOD  Result Value Ref Range Status   Specimen Description BLOOD BLOOD RIGHT HAND  Final   Special Requests   Final    BOTTLES DRAWN AEROBIC AND ANAEROBIC Blood Culture adequate volume   Culture   Final    NO GROWTH 5 DAYS Performed at [facility], [address] [place], [place] [address]    Report Status 03/11/2021 FINAL  Final  Culture, blood (Routine X 2) w Reflex to ID Panel     Status: None   Collection Time: 03/06/21 11:29 PM   Specimen: BLOOD  Result Value Ref Range Status   Specimen Description BLOOD BLOOD LEFT HAND  Final   Special Requests IN PEDIATRIC BOTTLE Blood Culture adequate volume  Final   Culture   Final    NO GROWTH 5 DAYS Performed at [facility], [address] [place], [place] [address]    Report Status 03/11/2021 FINAL  Final  Body fluid culture w Gram Stain     Status: None   Collection Time: 03/07/21  1:45 PM   Specimen: PATH Cytology Peritoneal fluid  Result Value Ref Range Status   Specimen Description   Final    PERITONEAL Performed at [facility], [address], [place], [place] [address]    Special Requests   Final    NONE Performed at [facility], [address] [place], [place] [address]    Gram Stain   Final    RARE WBC PRESENT, PREDOMINANTLY MONONUCLEAR NO ORGANISMS SEEN    Culture   Final    NO GROWTH Performed at [facility], [place] [address], [place], [place] [address]    Report Status 03/11/2021 FINAL  Final     Labs: BNP (last 3 results) Recent Labs    03/06/21 0854  BNP 417.4*   Basic Metabolic Panel: Recent Labs  Lab 03/07/21 0522 03/08/21 0758 03/09/21 0442 03/10/21 0629 03/11/21 0450  NA 139 135 138 139 140  K 4.2 3.7 3.9 4.2 4.3   CL 107 104 108 108 108  CO2 25 22 24 24 24   GLUCOSE 84 103* 99 108* 116*  BUN 53* 45* 39* 44* 45*  CREATININE 1.36* 1.19 1.03 1.29* 1.21  CALCIUM 8.8* 8.3* 8.1* 8.3* 8.5*  MG 1.9  --  1.7 1.8 1.9   Liver Function Tests: Recent Labs  Lab 03/07/21 0522 03/09/21 0442 03/10/21 0629 03/11/21 0450  AST 59* 103* 149* 109*  ALT 94* 99* 130* 119*  ALKPHOS 486* 530* 600* 609*  BILITOT 2.6* 2.3* 2.4* 2.1*  PROT 6.4* 5.9* 6.2* 6.2*  ALBUMIN 2.7* 2.4* 2.5* 2.4*   No results for input(s): LIPASE, AMYLASE in the last 168 hours. Recent Labs  Lab 03/08/21 0758 03/09/21 0442 03/10/21 0629 03/11/21 0450  AMMONIA 110* 108* 34 80*   CBC: Recent Labs  Lab 03/07/21 0522 03/08/21 0758 03/09/21 0442 03/10/21 0629 03/11/21 0450  WBC 5.4 5.4 4.8 11.9* 6.7  HGB 10.9* 11.2* 10.1* 11.7* 11.2*  HCT 32.1* 33.0* 30.1* 35.1* 33.4*  MCV 90.9 90.4 92.6 92.1 93.3  PLT 101* 88* 73* 66* 56*   Cardiac Enzymes: No results for input(s): CKTOTAL, CKMB, CKMBINDEX, TROPONINI in the last 168 hours. BNP: Invalid input(s): POCBNP CBG: Recent Labs  Lab 03/06/21 2228  GLUCAP 133*   D-Dimer No results for input(s): DDIMER in the last 72 hours. Hgb A1c No results for input(s): HGBA1C in the  last 72 hours. Lipid Profile No results for input(s): CHOL, HDL, LDLCALC, TRIG, CHOLHDL, LDLDIRECT in the last 72 hours. Thyroid function studies No results for input(s): TSH, T4TOTAL, T3FREE, THYROIDAB in the last 72 hours.  Invalid input(s): FREET3 Anemia work up No results for input(s): VITAMINB12, FOLATE, FERRITIN, TIBC, IRON, RETICCTPCT in the last 72 hours. Urinalysis    Component Value Date/Time   COLORURINE AMBER (A) 03/06/2021 2351   APPEARANCEUR CLEAR (A) 03/06/2021 2351   LABSPEC 1.039 (H) 03/06/2021 2351   PHURINE 5.0 03/06/2021 2351   GLUCOSEU NEGATIVE 03/06/2021 2351   HGBUR MODERATE (A) 03/06/2021 2351   BILIRUBINUR NEGATIVE 03/06/2021 2351   KETONESUR NEGATIVE 03/06/2021 2351    PROTEINUR NEGATIVE 03/06/2021 2351   NITRITE NEGATIVE 03/06/2021 2351   LEUKOCYTESUR NEGATIVE 03/06/2021 2351   Sepsis Labs Invalid input(s): PROCALCITONIN,  WBC,  LACTICIDVEN Microbiology Recent Results (from the past 240 hour(s))  Resp Panel by RT-PCR (Flu A&B, Covid) Nasopharyngeal Swab     Status: None   Collection Time: 03/06/21  8:48 AM   Specimen: Nasopharyngeal Swab; Nasopharyngeal(NP) swabs in vial transport medium  Result Value Ref Range Status   SARS Coronavirus 2 by RT PCR NEGATIVE NEGATIVE Final    Comment: (NOTE) SARS-CoV-2 target nucleic acids are NOT DETECTED.  The SARS-CoV-2 RNA is generally detectable in upper respiratory specimens during the acute phase of infection. The lowest concentration of SARS-CoV-2 viral copies this assay can detect is 138 copies/mL. A negative result does not preclude SARS-Cov-2 infection and should not be used as the sole basis for treatment or other patient management decisions. A negative result may occur with  improper specimen collection/handling, submission of specimen other than nasopharyngeal swab, presence of viral mutation(s) within the areas targeted by this assay, and inadequate number of viral copies(<138 copies/mL). A negative result must be combined with clinical observations, patient history, and epidemiological information. The expected result is Negative.  Fact Sheet for Patients:  EntrepreneurPulse.com.au  Fact Sheet for Healthcare Providers:  IncredibleEmployment.be  This test is no t yet approved or cleared by the [place] FDA and  has been authorized for detection and/or diagnosis of SARS-CoV-2 by FDA under an Emergency Use Authorization (EUA). This EUA will remain  in effect (meaning this test can be used) for the duration of the COVID-19 declaration under Section 564(b)(1) of the Act, 21 U.S.C.section 360bbb-3(b)(1), unless the authorization is terminated  or revoked  sooner.       Influenza A by PCR NEGATIVE NEGATIVE Final   Influenza B by PCR NEGATIVE NEGATIVE Final    Comment: (NOTE) The Xpert Xpress SARS-CoV-2/FLU/RSV plus assay is intended as an aid in the diagnosis of influenza from Nasopharyngeal swab specimens and should not be used as a sole basis for treatment. Nasal washings and aspirates are unacceptable for Xpert Xpress SARS-CoV-2/FLU/RSV testing.  Fact Sheet for Patients: EntrepreneurPulse.com.au  Fact Sheet for Healthcare Providers: IncredibleEmployment.be  This test is not yet approved or cleared by the [place] FDA and has been authorized for detection and/or diagnosis of SARS-CoV-2 by FDA under an Emergency Use Authorization (EUA). This EUA will remain in effect (meaning this test can be used) for the duration of the COVID-19 declaration under Section 564(b)(1) of the Act, 21 U.S.C. section 360bbb-3(b)(1), unless the authorization is terminated or revoked.  Performed at [facility], [address] [place], [place] [address]   Culture, blood (Routine X 2) w Reflex to ID Panel     Status: None   Collection  Time: 03/06/21 11:29 PM   Specimen: BLOOD  Result Value Ref Range Status   Specimen Description BLOOD BLOOD RIGHT HAND  Final   Special Requests   Final    BOTTLES DRAWN AEROBIC AND ANAEROBIC Blood Culture adequate volume   Culture   Final    NO GROWTH 5 DAYS Performed at [facility], [address], [place], [place] [address]    Report Status 03/11/2021 FINAL  Final  Culture, blood (Routine X 2) w Reflex to ID Panel     Status: None   Collection Time: 03/06/21 11:29 PM   Specimen: BLOOD  Result Value Ref Range Status   Specimen Description BLOOD BLOOD LEFT HAND  Final   Special Requests IN PEDIATRIC BOTTLE Blood Culture adequate volume  Final   Culture   Final    NO GROWTH 5 DAYS Performed at [facility], [address] [place],  [place] [address]    Report Status 03/11/2021 FINAL  Final  Body fluid culture w Gram Stain     Status: None   Collection Time: 03/07/21  1:45 PM   Specimen: PATH Cytology Peritoneal fluid  Result Value Ref Range Status   Specimen Description   Final    PERITONEAL Performed at [facility], [address] [place], [place] [address]    Special Requests   Final    NONE Performed at [facility], [address] [place], [place] [address]    Gram Stain   Final    RARE WBC PRESENT, PREDOMINANTLY MONONUCLEAR NO ORGANISMS SEEN    Culture   Final    NO GROWTH Performed at [facility], [place] [address], [place], [place] [address]    Report Status 03/11/2021 FINAL  Final     Patient was seen and examined on the day of discharge and was found to be in stable condition. Time coordinating discharge: 25 minutes including assessment and coordination of care, as well as examination of the patient.   SIGNED:  Dessa Phi, DO [facility] 03/13/2021, 9:06 AM

## 2021-03-13 NOTE — Progress Notes (Signed)
Pt called out to nursing station stating he was wet. CNA entered the room and found patient in bed with pump/pole/telemetry laying across his wrist and thighs. Pt reported he jerked his the cords hard thinking it was his call button and jerked the pole on top of him. Pt denied it hitting his head. Vitals obtained and stable. Pt had a red/bruised area to his right forearm/wrist. No broken skin noted.  Clayton Fee NP made aware and per patient his sister Clayton Gill was on the phone with him when RN was in the room. Sister was notified. Clayton Fee NP msg if patient continue to complain of pain an xray could be ordered. Pt refused and stated it was better and he did not need an xray.

## 2021-03-13 NOTE — Progress Notes (Signed)
[place] [facility]) Hospital Liaison RN note:  Clayton Gill does have a room to offer today. Hospital care team is aware. Spoke with patient's uncle, Eddie Dibbles to explain services. He verbalized understanding and will sign consents at the [facility]ospice [facility] this morning at 9:30. Transport has been arranged for 11:30. I will fax the discharge summary to the Hospice Home once it is available.  Please call with any hospice related questions or concerns.  Thank you for the opportunity to participate in this patient's care.  Zandra Abts, RN [facility] Liaison [phone]

## 2021-03-13 NOTE — Plan of Care (Signed)
Patient had respirations of 10 at the beginning of the shift. Flagged yellow MEWs and B. Randol Kern NP notified. Oxy was d/c. Later Dilaudid dose decreased due to during this admission the patient had to receive Narcan. Pt continued to have complaints of pain and that he needed the oxy reordered. B.Morrison NP messaged and a lower dose of oxy was ordered. Pt was resting comfortable after the dose of oxy.  Problem: Education: Goal: Knowledge of General Education information will improve Description: Including pain rating scale, medication(s)/side effects and non-pharmacologic comfort measures Outcome: Progressing   Problem: Health Behavior/Discharge Planning: Goal: Ability to manage health-related needs will improve Outcome: Progressing   Problem: Clinical Measurements: Goal: Ability to maintain clinical measurements within normal limits will improve Outcome: Progressing Goal: Will remain free from infection Outcome: Progressing Goal: Diagnostic test results will improve Outcome: Progressing Goal: Respiratory complications will improve Outcome: Progressing Goal: Cardiovascular complication will be avoided Outcome: Progressing   Problem: Activity: Goal: Risk for activity intolerance will decrease Outcome: Progressing   Problem: Nutrition: Goal: Adequate nutrition will be maintained Outcome: Progressing   Problem: Coping: Goal: Level of anxiety will decrease Outcome: Progressing   Problem: Elimination: Goal: Will not experience complications related to bowel motility Outcome: Progressing Goal: Will not experience complications related to urinary retention Outcome: Progressing   Problem: Pain Managment: Goal: General experience of comfort will improve Outcome: Progressing   Problem: Safety: Goal: Ability to remain free from injury will improve Outcome: Progressing   Problem: Skin Integrity: Goal: Risk for impaired skin integrity will decrease Outcome: Progressing

## 2021-03-13 NOTE — Progress Notes (Signed)
OT Cancellation Note  Patient Details Name: Clayton Gill MRN: [id number] DOB: 03-Jan-1951   Cancelled Treatment:    Reason Eval/Treat Not Completed: Other (comment). Chart reviewed.  Per chart, plan for pt to go to hospice home.  D/t this, therapy will sign off at this time, per conversation with PT - MD agreeable to therapy signing off.   Dessie Coma, M.S. OTR/L  03/13/21, 8:03 AM  ascom [phone]

## 2021-03-29 IMAGING — CT CT CHEST LUNG CANCER SCREENING LOW DOSE W/O CM
2 of 5 series · 15 of 40 positions shown, 18 images · non-contrast
Comparison: 10/19/2019

CLINICAL DATA: Lung cancer screening. 30.5 pack-year history.
Current asymptomatic smoker.

EXAM:
CT CHEST WITHOUT CONTRAST LOW-DOSE FOR LUNG CANCER SCREENING
TECHNIQUE: Multidetector CT imaging of the chest was performed following the
standard protocol without IV contrast.

[Series 3: lung 1.00 · axial · 0.72mm/px · z∈[-1232,-891]mm · 12 of 377 slices shown, 15 images]
[im 18/377  mediastinal]
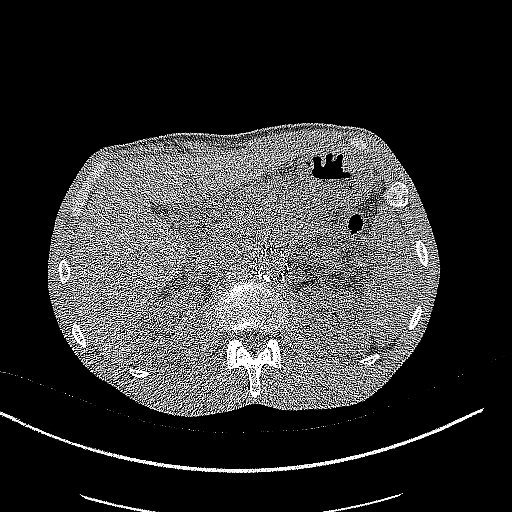
[im 18/377  lung]
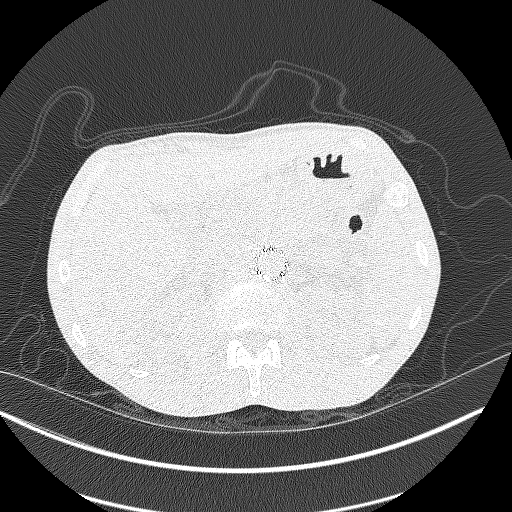
[im 52/377  lung]
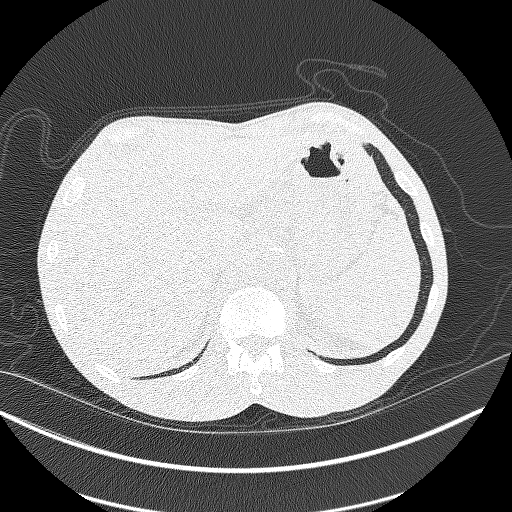
[im 86/377  lung]
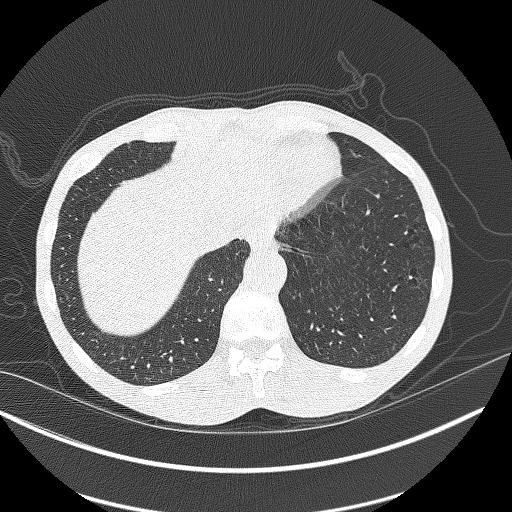
[im 120/377  lung]
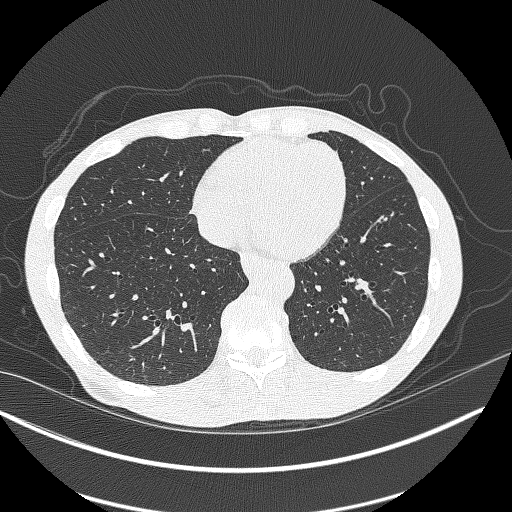
[im 137/377  mediastinal]
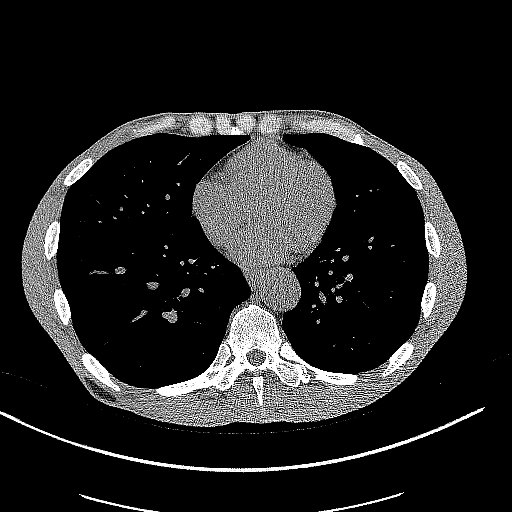
[im 137/377  lung]
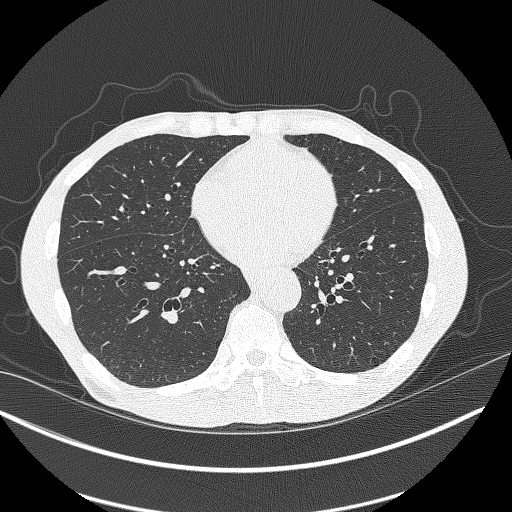
[im 171/377  lung]
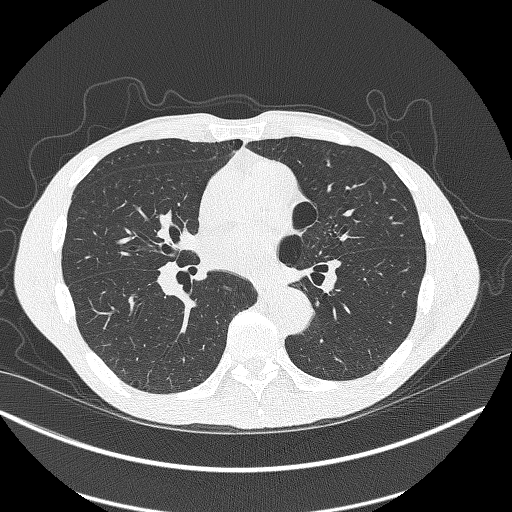
[im 206/377  lung]
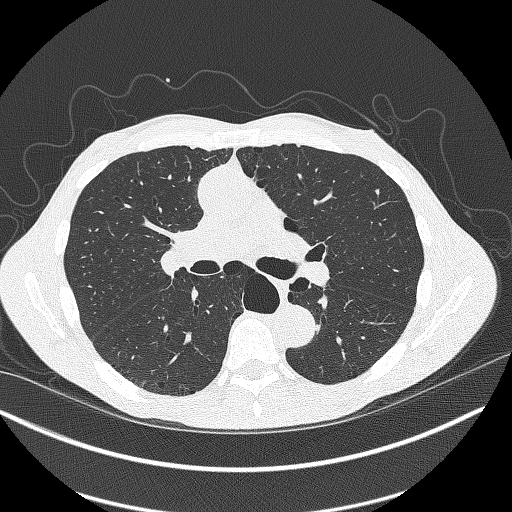
[im 240/377  lung]
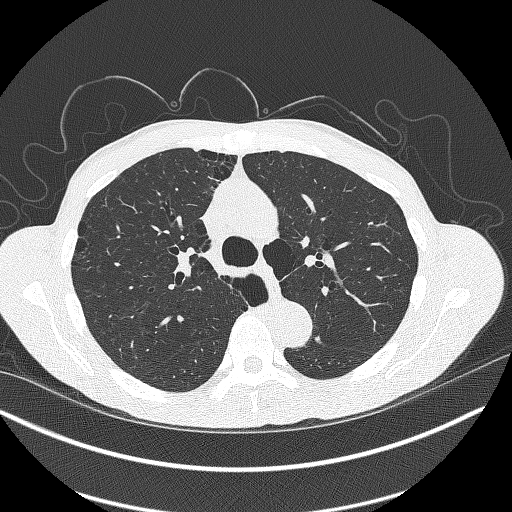
[im 257/377  mediastinal]
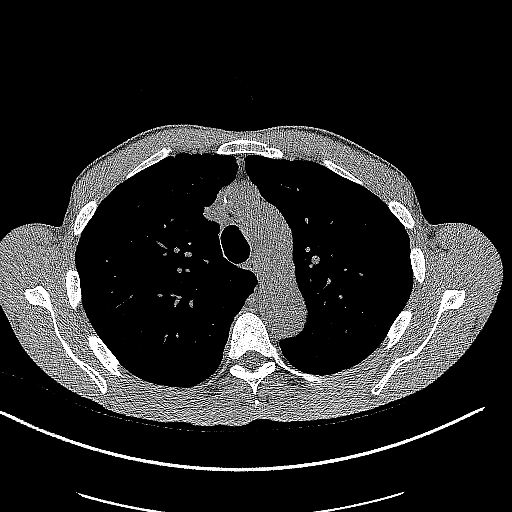
[im 257/377  lung]
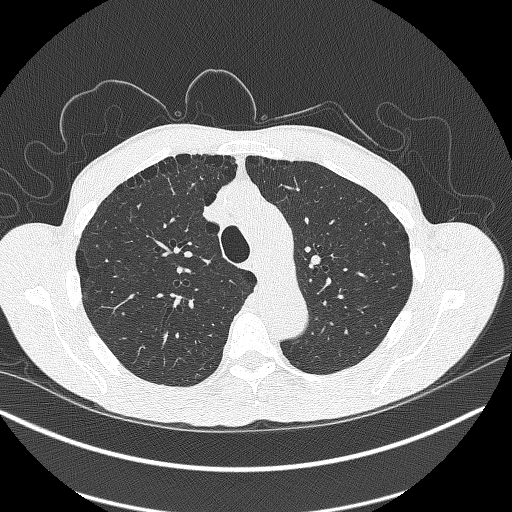
[im 291/377  lung]
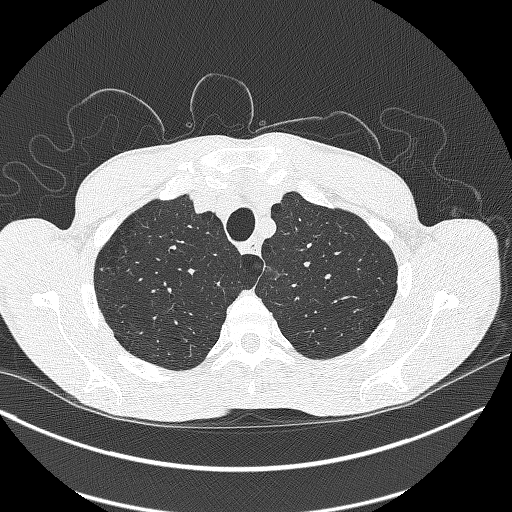
[im 325/377  lung]
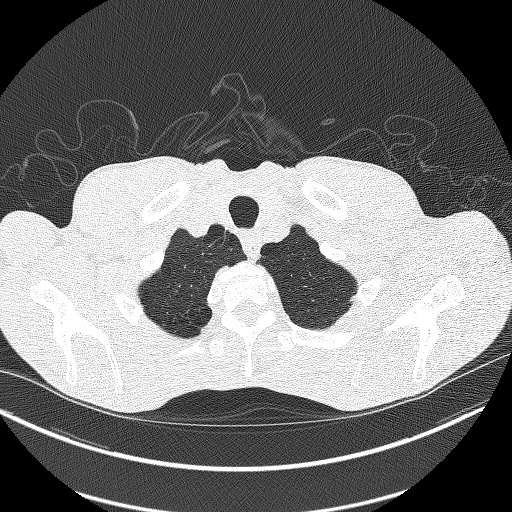
[im 359/377  lung]
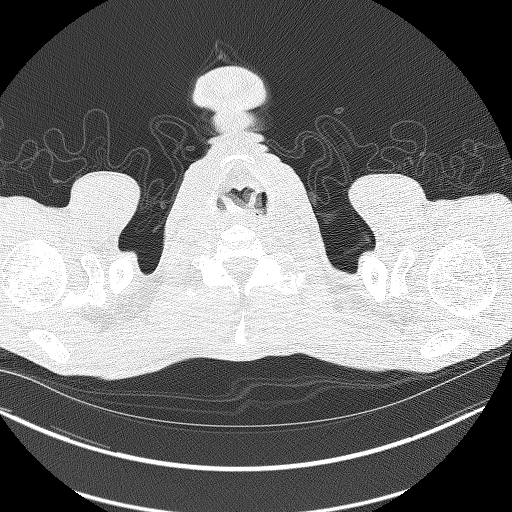

[Series 4: coronals lung 1.00 cor · coronal · 0.72mm/px · 3 of 302 slices shown]
[im 61/302  lung]
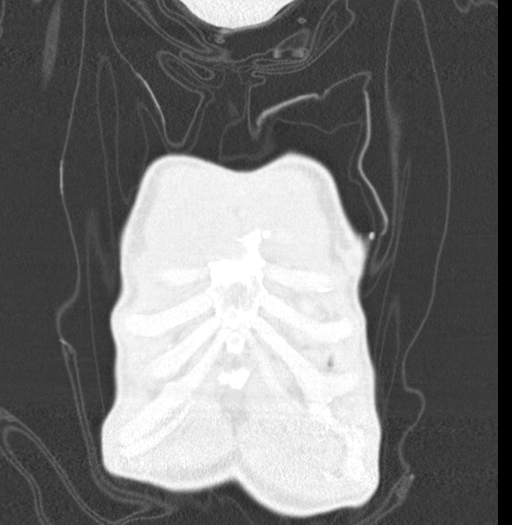
[im 121/302  lung]
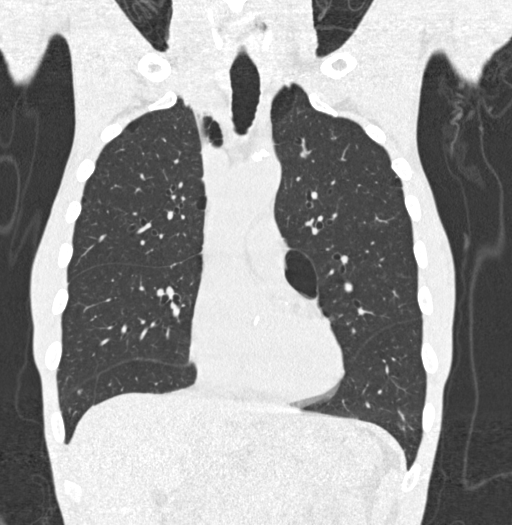
[im 181/302  lung]
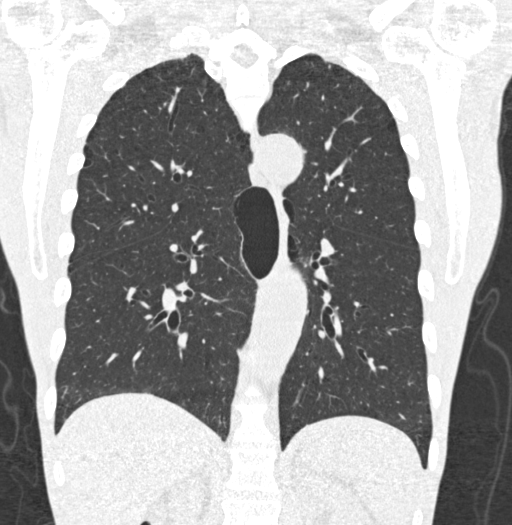

[15 of 40 positions shown; findings below may reference images not displayed]

FINDINGS: Cardiovascular: The heart size appears within normal limits. Aortic
atherosclerosis. Coronary artery calcification.

Mediastinum/Nodes: No enlarged mediastinal, hilar, or axillary lymph
nodes. Thyroid gland, trachea, and esophagus demonstrate no
significant findings.

Lungs/Pleura: No pleural effusion identified. No airspace
consolidation, atelectasis or pneumothorax. Paraseptal and
centrilobular emphysema identified. Previously noted lung nodules
are unchanged in the interval. Two new lung nodules are identified
within the right lower lobe. The largest has an equivalent diameter
of 4.2 mm.

Upper Abdomen: No acute abnormality. Previous cholecystectomy.
Aortic atherosclerosis. Stent graft repair of the abdominal aorta
noted.

Musculoskeletal: No chest wall mass or suspicious bone lesions
identified.
IMPRESSION: 1. Lung-RADS 3, probably benign findings. Short-term follow-up in 6
months is recommended with repeat low-dose chest CT without contrast
(please use the following order, "CT CHEST LCS NODULE FOLLOW-UP W/O
CM").
2. Coronary artery calcifications noted.

Aortic Atherosclerosis ([id number].V) and Emphysema ([id number].C).

## 2021-04-01 ENCOUNTER — Ambulatory Visit (INDEPENDENT_AMBULATORY_CARE_PROVIDER_SITE_OTHER): Payer: Medicare HMO | Admitting: Vascular Surgery

## 2021-04-01 ENCOUNTER — Encounter (INDEPENDENT_AMBULATORY_CARE_PROVIDER_SITE_OTHER): Payer: Medicare HMO

## 2021-04-02 ENCOUNTER — Encounter (INDEPENDENT_AMBULATORY_CARE_PROVIDER_SITE_OTHER): Payer: Self-pay | Admitting: Vascular Surgery

## 2021-04-06 DEATH — deceased

## 2021-05-07 ENCOUNTER — Telehealth: Payer: Self-pay

## 2021-05-07 NOTE — Telephone Encounter (Signed)
I didn't contact this patient for his annual lung screening CT scan. 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 He had a chest CT with [facility] Jan 2022. The notes look like they are discussing hospice/pallitive care also.
